# Patient Record
Sex: Male | Born: 1955 | Race: Black or African American | Hispanic: No | Marital: Single | Smoking: Former smoker
Health system: Southern US, Community
[De-identification: ages and names within clinical notes are randomized; demographics above are authoritative.]

## PROBLEM LIST (undated history)

## (undated) DIAGNOSIS — E119 Type 2 diabetes mellitus without complications: Secondary | ICD-10-CM

## (undated) DIAGNOSIS — R569 Unspecified convulsions: Secondary | ICD-10-CM

## (undated) DIAGNOSIS — I10 Essential (primary) hypertension: Secondary | ICD-10-CM

## (undated) DIAGNOSIS — K529 Noninfective gastroenteritis and colitis, unspecified: Secondary | ICD-10-CM

## (undated) DIAGNOSIS — N289 Disorder of kidney and ureter, unspecified: Secondary | ICD-10-CM

## (undated) HISTORY — PX: CHOLECYSTECTOMY: SHX55

## (undated) HISTORY — PX: JOINT REPLACEMENT: SHX530

---

## 2012-09-18 ENCOUNTER — Emergency Department (HOSPITAL_COMMUNITY)

## 2012-09-18 ENCOUNTER — Encounter (HOSPITAL_COMMUNITY): Payer: Self-pay | Admitting: *Deleted

## 2012-09-18 ENCOUNTER — Other Ambulatory Visit: Payer: Self-pay

## 2012-09-18 ENCOUNTER — Emergency Department (HOSPITAL_COMMUNITY)
Admission: EM | Admit: 2012-09-18 | Discharge: 2012-09-18 | Disposition: A | Discharge status: Home / Self Care | Attending: Emergency Medicine | Admitting: Emergency Medicine

## 2012-09-18 DIAGNOSIS — R071 Chest pain on breathing: Secondary | ICD-10-CM | POA: Insufficient documentation

## 2012-09-18 DIAGNOSIS — E119 Type 2 diabetes mellitus without complications: Secondary | ICD-10-CM | POA: Insufficient documentation

## 2012-09-18 DIAGNOSIS — Z87891 Personal history of nicotine dependence: Secondary | ICD-10-CM | POA: Insufficient documentation

## 2012-09-18 DIAGNOSIS — R079 Chest pain, unspecified: Secondary | ICD-10-CM

## 2012-09-18 DIAGNOSIS — Z880 Allergy status to penicillin: Secondary | ICD-10-CM | POA: Insufficient documentation

## 2012-09-18 DIAGNOSIS — R0789 Other chest pain: Secondary | ICD-10-CM

## 2012-09-18 DIAGNOSIS — Z79899 Other long term (current) drug therapy: Secondary | ICD-10-CM | POA: Insufficient documentation

## 2012-09-18 DIAGNOSIS — Z7982 Long term (current) use of aspirin: Secondary | ICD-10-CM | POA: Insufficient documentation

## 2012-09-18 HISTORY — DX: Type 2 diabetes mellitus without complications: E11.9

## 2012-09-18 LAB — BASIC METABOLIC PANEL
BUN: 13 mg/dL (ref 6–23)
CO2: 31 mEq/L (ref 19–32)
Chloride: 99 mEq/L (ref 96–112)
Creatinine, Ser: 0.66 mg/dL (ref 0.50–1.35)
Glucose, Bld: 239 mg/dL — ABNORMAL HIGH (ref 70–99)

## 2012-09-18 LAB — CBC WITH DIFFERENTIAL/PLATELET
Basophils Relative: 1 % (ref 0–1)
HCT: 37.1 % — ABNORMAL LOW (ref 39.0–52.0)
Hemoglobin: 12 g/dL — ABNORMAL LOW (ref 13.0–17.0)
Lymphocytes Relative: 60 % — ABNORMAL HIGH (ref 12–46)
Lymphs Abs: 2.4 10*3/uL (ref 0.7–4.0)
MCHC: 32.3 g/dL (ref 30.0–36.0)
Monocytes Absolute: 0.3 10*3/uL (ref 0.1–1.0)
Monocytes Relative: 8 % (ref 3–12)
Neutro Abs: 1.2 10*3/uL — ABNORMAL LOW (ref 1.7–7.7)
RBC: 4.4 MIL/uL (ref 4.22–5.81)

## 2012-09-18 LAB — TROPONIN I: Troponin I: <0.3 ng/mL (ref <0.30)

## 2012-09-18 MED ORDER — ASPIRIN 81 MG PO CHEW
324.0000 mg | CHEWABLE_TABLET | Freq: Once | ORAL | Status: AC
  Administered 2012-09-18: 324 mg via ORAL
  Filled 2012-09-18: qty 4

## 2012-09-18 MED ORDER — TRAMADOL HCL 50 MG PO TABS: 50.0000 mg | ORAL_TABLET | Freq: Four times a day (QID) | ORAL | Status: DC | PRN

## 2012-09-18 MED ORDER — ASPIRIN 81 MG PO CHEW: 81.0000 mg | CHEWABLE_TABLET | Freq: Every day | ORAL | Status: DC

## 2012-09-18 NOTE — ED Notes (Signed)
Sharp stabbing pain in L lateral chest at about 1550.  Now it is just sore.  No SOB, nausea/vomiting.

## 2012-09-18 NOTE — ED Provider Notes (Addendum)
History  This chart was scribed for Mervin Kung, MD by Elby Beck, ED Scribe. This patient was seen in room APA06/APA06 and the patient's care was started at 5:50 PM.   CSN: [id number]  Arrival date & time 09/18/12  1731     Chief Complaint  Patient presents with  . Chest Pain     The history is provided by the patient. No language interpreter was used.    HPI Comments: Eddie Watson is a 57 y.o. male who presents to the Emergency Department complaining of a 5-10 minute episode of stabbing, severe, "10/10" pain in his left lateral chest that occurred about 2 hours ago. Pt is insistent that he is "fine now". Pt admits that when he moves his left arm or moves his torso, he has a recurrence of mild-moderate pain, but he has no pain when he still or lying supine. Pt states that he has no h/o heart problems. Pt denies SOB, nausea, vomiting, fever, chills or any other symptoms. Pt denies alcohol use and is a former smoker.   Past Medical History  Diagnosis Date  . Diabetes mellitus without complication     Past Surgical History  Procedure Laterality Date  . Joint replacement Right      x 2    History reviewed. No pertinent family history.  History  Substance Use Topics  . Smoking status: Former Research scientist (life sciences)  . Smokeless tobacco: Never Used  . Alcohol Use: No      Review of Systems  Constitutional: Negative for fever and chills.  HENT: Negative for congestion, sore throat and neck pain.   Eyes: Negative for visual disturbance.  Respiratory: Negative for cough and shortness of breath.   Cardiovascular: Positive for chest pain. Negative for leg swelling.  Gastrointestinal: Negative for nausea, vomiting, abdominal pain and diarrhea.  Genitourinary: Negative for dysuria and hematuria.  Musculoskeletal: Negative for back pain.  Skin: Negative for rash.  Neurological: Negative for headaches.  A complete 10 system review of systems was obtained and all systems are  negative except as noted in the HPI and PMH.    Allergies  Penicillins  Home Medications   Current Outpatient Rx  Name  Route  Sig  Dispense  Refill  . glipiZIDE (GLUCOTROL) 10 MG tablet   Oral   Take 10 mg by mouth daily.         . metFORMIN (GLUCOPHAGE) 500 MG tablet   Oral   Take 500 mg by mouth 2 (two) times daily with a meal.         . aspirin 81 MG chewable tablet   Oral   Chew 1 tablet (81 mg total) by mouth daily.   30 tablet   0   . traMADol (ULTRAM) 50 MG tablet   Oral   Take 1 tablet (50 mg total) by mouth every 6 (six) hours as needed for pain.   15 tablet   0     Triage Vitals: BP 125/79  Pulse 84  Temp(Src) 98.3 F (36.8 C) (Oral)  Resp 17  Ht 6' (1.829 m)  Wt 156 lb (70.761 kg)  BMI 21.15 kg/m2  SpO2 99%  Physical Exam  Nursing note and vitals reviewed. Constitutional: He is oriented to person, place, and time. He appears well-developed and well-nourished.  HENT:  Head: Normocephalic and atraumatic.  Eyes: EOM are normal. Pupils are equal, round, and reactive to light.  Neck: Normal range of motion. Neck supple. No tracheal deviation present.  Cardiovascular:  Normal rate, regular rhythm, normal heart sounds and intact distal pulses.   No murmur heard. Pulmonary/Chest: Effort normal and breath sounds normal. No respiratory distress.  Abdominal: Soft. Bowel sounds are normal. There is no tenderness.  Musculoskeletal: Normal range of motion. He exhibits no tenderness.  Neurological: He is alert and oriented to person, place, and time.  Skin: Skin is warm and dry. No rash noted.  Psychiatric: He has a normal mood and affect.    ED Course  Procedures (including critical care time)  DIAGNOSTIC STUDIES: Oxygen Saturation is 99% on RA, normal by my interpretation.    COORDINATION OF CARE: 5:58 PM- Pt advised of plan for treatment and pt agrees.  Medications  aspirin chewable tablet 324 mg (324 mg Oral Given 09/18/12 1811)      Labs  Reviewed  CBC WITH DIFFERENTIAL - Abnormal; Notable for the following:    Hemoglobin 12.0 (*)    HCT 37.1 (*)    Neutrophils Relative % 30 (*)    Neutro Abs 1.2 (*)    Lymphocytes Relative 60 (*)    All other components within normal limits  BASIC METABOLIC PANEL - Abnormal; Notable for the following:    Glucose, Bld 239 (*)    All other components within normal limits  TROPONIN I   Dg Chest 2 View  09/18/2012   *RADIOLOGY REPORT*  Clinical Data: Left chest pain  CHEST - 2 VIEW  Comparison: None  Findings: Lung volume is normal.  Lungs are clear without infiltrate or effusion.  Negative for heart failure or mass lesion.  IMPRESSION: No acute abnormality.   Original Report Authenticated By: Carl Best, M.D.   Results for orders placed during the hospital encounter of 09/18/12  TROPONIN I      Result Value Range   Troponin I <0.30  <0.30 ng/mL  CBC WITH DIFFERENTIAL      Result Value Range   WBC 4.0  4.0 - 10.5 K/uL   RBC 4.40  4.22 - 5.81 MIL/uL   Hemoglobin 12.0 (*) 13.0 - 17.0 g/dL   HCT 37.1 (*) 39.0 - 52.0 %   MCV 84.3  78.0 - 100.0 fL   MCH 27.3  26.0 - 34.0 pg   MCHC 32.3  30.0 - 36.0 g/dL   RDW 11.6  11.5 - 15.5 %   Platelets 199  150 - 400 K/uL   Neutrophils Relative % 30 (*) 43 - 77 %   Neutro Abs 1.2 (*) 1.7 - 7.7 K/uL   Lymphocytes Relative 60 (*) 12 - 46 %   Lymphs Abs 2.4  0.7 - 4.0 K/uL   Monocytes Relative 8  3 - 12 %   Monocytes Absolute 0.3  0.1 - 1.0 K/uL   Eosinophils Relative 2  0 - 5 %   Eosinophils Absolute 0.1  0.0 - 0.7 K/uL   Basophils Relative 1  0 - 1 %   Basophils Absolute 0.0  0.0 - 0.1 K/uL  BASIC METABOLIC PANEL      Result Value Range   Sodium 138  135 - 145 mEq/L   Potassium 3.5  3.5 - 5.1 mEq/L   Chloride 99  96 - 112 mEq/L   CO2 31  19 - 32 mEq/L   Glucose, Bld 239 (*) 70 - 99 mg/dL   BUN 13  6 - 23 mg/dL   Creatinine, Ser 0.66  0.50 - 1.35 mg/dL   Calcium 9.5  8.4 - 10.5 mg/dL   GFR calc  non Af Amer >90  >90 mL/min   GFR calc Af  Amer >90  >90 mL/min    Date: 09/18/2012  Rate: 63  Rhythm: normal sinus rhythm  QRS Axis: normal  Intervals: normal  ST/T Wave abnormalities: normal  Conduction Disutrbances:none  Narrative Interpretation:   Old EKG Reviewed: none available    1. Chest pain   2. Chest wall pain       MDM  Patient's chest pain is very chest wall in nature. EKG without acute changes troponins negative. No evidence of an acute cardiac event. Chest x-ray negative for pneumonia or pneumothorax. Will treat as if chest wall pain. Will start the patient on a baby aspirin a day just based on his age. Patient will return for any newer worse symptoms.  Patient's chest pain now is only present with certain positions and movements. Clinically I feel this is related to a pulmonary embolus. Room-air saturations are 99%.         I personally performed the services described in this documentation, which was scribed in my presence. The recorded information has been reviewed and is accurate.     Mervin Kung, MD 09/18/12 [id number]  Mervin Kung, MD 09/18/12 [phone]

## 2013-09-23 ENCOUNTER — Emergency Department (HOSPITAL_COMMUNITY)

## 2013-09-23 ENCOUNTER — Encounter (HOSPITAL_COMMUNITY): Payer: Self-pay | Admitting: Emergency Medicine

## 2013-09-23 ENCOUNTER — Observation Stay (HOSPITAL_COMMUNITY)
Admission: EM | Admit: 2013-09-23 | Discharge: 2013-09-24 | Disposition: A | Discharge status: Home / Self Care | Attending: Internal Medicine | Admitting: Internal Medicine

## 2013-09-23 DIAGNOSIS — R079 Chest pain, unspecified: Secondary | ICD-10-CM

## 2013-09-23 DIAGNOSIS — E119 Type 2 diabetes mellitus without complications: Secondary | ICD-10-CM

## 2013-09-23 DIAGNOSIS — Z880 Allergy status to penicillin: Secondary | ICD-10-CM | POA: Insufficient documentation

## 2013-09-23 DIAGNOSIS — R0602 Shortness of breath: Secondary | ICD-10-CM | POA: Insufficient documentation

## 2013-09-23 DIAGNOSIS — K529 Noninfective gastroenteritis and colitis, unspecified: Secondary | ICD-10-CM | POA: Diagnosis present

## 2013-09-23 DIAGNOSIS — R072 Precordial pain: Principal | ICD-10-CM | POA: Insufficient documentation

## 2013-09-23 DIAGNOSIS — I10 Essential (primary) hypertension: Secondary | ICD-10-CM | POA: Diagnosis present

## 2013-09-23 DIAGNOSIS — R748 Abnormal levels of other serum enzymes: Secondary | ICD-10-CM

## 2013-09-23 DIAGNOSIS — D649 Anemia, unspecified: Secondary | ICD-10-CM | POA: Diagnosis present

## 2013-09-23 DIAGNOSIS — Z79899 Other long term (current) drug therapy: Secondary | ICD-10-CM | POA: Insufficient documentation

## 2013-09-23 DIAGNOSIS — Z7982 Long term (current) use of aspirin: Secondary | ICD-10-CM | POA: Insufficient documentation

## 2013-09-23 DIAGNOSIS — R61 Generalized hyperhidrosis: Secondary | ICD-10-CM | POA: Insufficient documentation

## 2013-09-23 DIAGNOSIS — Z87891 Personal history of nicotine dependence: Secondary | ICD-10-CM | POA: Insufficient documentation

## 2013-09-23 HISTORY — DX: Noninfective gastroenteritis and colitis, unspecified: K52.9

## 2013-09-23 LAB — GLUCOSE, CAPILLARY
Glucose-Capillary: 261 mg/dL — ABNORMAL HIGH (ref 70–99)
Glucose-Capillary: 170 mg/dL — ABNORMAL HIGH (ref 70–99)

## 2013-09-23 LAB — CBC WITH DIFFERENTIAL/PLATELET
BASOS ABS: 0 10*3/uL (ref 0.0–0.1)
Basophils Relative: 1 % (ref 0–1)
EOS PCT: 1 % (ref 0–5)
Eosinophils Absolute: 0.1 10*3/uL (ref 0.0–0.7)
HCT: 34.7 % — ABNORMAL LOW (ref 39.0–52.0)
Hemoglobin: 11.3 g/dL — ABNORMAL LOW (ref 13.0–17.0)
LYMPHS PCT: 46 % (ref 12–46)
Lymphs Abs: 2 10*3/uL (ref 0.7–4.0)
MCH: 27.2 pg (ref 26.0–34.0)
MCHC: 32.6 g/dL (ref 30.0–36.0)
MCV: 83.6 fL (ref 78.0–100.0)
Monocytes Absolute: 0.3 10*3/uL (ref 0.1–1.0)
Monocytes Relative: 7 % (ref 3–12)
NEUTROS ABS: 1.9 10*3/uL (ref 1.7–7.7)
NEUTROS PCT: 45 % (ref 43–77)
PLATELETS: 204 10*3/uL (ref 150–400)
RBC: 4.15 MIL/uL — ABNORMAL LOW (ref 4.22–5.81)
RDW: 12.1 % (ref 11.5–15.5)
WBC: 4.3 10*3/uL (ref 4.0–10.5)

## 2013-09-23 LAB — COMPREHENSIVE METABOLIC PANEL
ALK PHOS: 106 U/L (ref 39–117)
ALT: 57 U/L — AB (ref 0–53)
AST: 44 U/L — AB (ref 0–37)
Albumin: 3.3 g/dL — ABNORMAL LOW (ref 3.5–5.2)
BUN: 17 mg/dL (ref 6–23)
CALCIUM: 9 mg/dL (ref 8.4–10.5)
CO2: 28 meq/L (ref 19–32)
Chloride: 102 mEq/L (ref 96–112)
Creatinine, Ser: 0.88 mg/dL (ref 0.50–1.35)
GFR calc Af Amer: >90 mL/min (ref >90)
Glucose, Bld: 332 mg/dL — ABNORMAL HIGH (ref 70–99)
POTASSIUM: 4.3 meq/L (ref 3.7–5.3)
SODIUM: 140 meq/L (ref 137–147)
Total Bilirubin: 0.4 mg/dL (ref 0.3–1.2)
Total Protein: 7.1 g/dL (ref 6.0–8.3)

## 2013-09-23 LAB — TROPONIN I: Troponin I: <0.3 ng/mL (ref <0.30)

## 2013-09-23 LAB — LIPID PANEL
CHOL/HDL RATIO: 2.3 ratio
CHOLESTEROL: 110 mg/dL (ref 0–200)
HDL: 48 mg/dL (ref >39)
LDL Cholesterol: 28 mg/dL (ref 0–99)
TRIGLYCERIDES: 169 mg/dL — AB (ref <150)
VLDL: 34 mg/dL (ref 0–40)

## 2013-09-23 LAB — D-DIMER, QUANTITATIVE (NOT AT ARMC)

## 2013-09-23 LAB — LIPASE, BLOOD: Lipase: 53 U/L (ref 11–59)

## 2013-09-23 MED ORDER — ASPIRIN EC 325 MG PO TBEC
325.0000 mg | DELAYED_RELEASE_TABLET | Freq: Every day | ORAL | Status: DC
  Administered 2013-09-24: 325 mg via ORAL
  Filled 2013-09-23: qty 1

## 2013-09-23 MED ORDER — INSULIN ASPART 100 UNIT/ML ~~LOC~~ SOLN: 0.0000 [IU] | Freq: Every day | SUBCUTANEOUS | Status: DC

## 2013-09-23 MED ORDER — LOPERAMIDE HCL 2 MG PO CAPS
2.0000 mg | ORAL_CAPSULE | ORAL | Status: DC | PRN
  Administered 2013-09-23 – 2013-09-24 (×5): 2 mg via ORAL
  Filled 2013-09-23 (×6): qty 1

## 2013-09-23 MED ORDER — ALUM & MAG HYDROXIDE-SIMETH 200-200-20 MG/5ML PO SUSP: 30.0000 mL | Freq: Four times a day (QID) | ORAL | Status: DC | PRN

## 2013-09-23 MED ORDER — SODIUM CHLORIDE 0.9 % IV SOLN
INTRAVENOUS | Status: AC
  Administered 2013-09-23: 16:00:00 via INTRAVENOUS

## 2013-09-23 MED ORDER — ONDANSETRON HCL 4 MG PO TABS: 4.0000 mg | ORAL_TABLET | Freq: Four times a day (QID) | ORAL | Status: DC | PRN

## 2013-09-23 MED ORDER — ENOXAPARIN SODIUM 40 MG/0.4ML ~~LOC~~ SOLN: 40.0000 mg | Freq: Every day | SUBCUTANEOUS | Status: DC

## 2013-09-23 MED ORDER — ONDANSETRON HCL 4 MG/2ML IJ SOLN: 4.0000 mg | Freq: Four times a day (QID) | INTRAMUSCULAR | Status: DC | PRN

## 2013-09-23 MED ORDER — SODIUM CHLORIDE 0.9 % IV SOLN
INTRAVENOUS | Status: DC
  Administered 2013-09-23: 10:00:00 via INTRAVENOUS

## 2013-09-23 MED ORDER — ACETAMINOPHEN 650 MG RE SUPP: 650.0000 mg | Freq: Four times a day (QID) | RECTAL | Status: DC | PRN

## 2013-09-23 MED ORDER — BENAZEPRIL HCL 10 MG PO TABS
10.0000 mg | ORAL_TABLET | Freq: Every day | ORAL | Status: DC
  Administered 2013-09-23 – 2013-09-24 (×2): 10 mg via ORAL
  Filled 2013-09-23 (×2): qty 1

## 2013-09-23 MED ORDER — ACETAMINOPHEN 325 MG PO TABS: 650.0000 mg | ORAL_TABLET | Freq: Four times a day (QID) | ORAL | Status: DC | PRN

## 2013-09-23 MED ORDER — HYDROCODONE-ACETAMINOPHEN 5-325 MG PO TABS: 1.0000 | ORAL_TABLET | ORAL | Status: DC | PRN

## 2013-09-23 MED ORDER — INSULIN ASPART 100 UNIT/ML ~~LOC~~ SOLN
0.0000 [IU] | Freq: Three times a day (TID) | SUBCUTANEOUS | Status: DC
  Administered 2013-09-23: 11 [IU] via SUBCUTANEOUS
  Administered 2013-09-24: 8 [IU] via SUBCUTANEOUS

## 2013-09-23 MED ORDER — MORPHINE SULFATE 2 MG/ML IJ SOLN: 1.0000 mg | INTRAMUSCULAR | Status: DC | PRN

## 2013-09-23 MED ORDER — NITROGLYCERIN 0.4 MG SL SUBL: 0.4000 mg | SUBLINGUAL_TABLET | SUBLINGUAL | Status: DC | PRN

## 2013-09-23 MED ORDER — SODIUM CHLORIDE 0.9 % IJ SOLN
3.0000 mL | Freq: Two times a day (BID) | INTRAMUSCULAR | Status: DC
  Administered 2013-09-23: 3 mL via INTRAVENOUS

## 2013-09-23 NOTE — Consult Note (Signed)
Reason for Consult: Chest pain Referring Physician: PTH  Eddie Watson is an 58 y.o. male.  HPI: This is a 58 year old male diabetic patient who was transferred from a correctional facility with chest pain. He has no prior cardiac history. Patient describes sharp shooting left-sided chest pain radiating to his shoulders and down his leg. It occurred when he was in class.Patient was given aspirin 324 mg, 3 nitroglycerin, and Zofran 4 mg which improved pain. He says now it hurts when he talks but isn't as severe. He denies any heavy pressure, tightness, dyspnea, palpitations, dizziness, or presyncope. He walks about 7 miles per week without symptoms. His blood pressure was elevated. EKG NSR with nonspecific ST Twave changes, first troponin negative.  He has no prior history of hypertension. He smoked for about 4 years but quit 20 years ago. He has no family history of CAD or hyperlipidemia.    Past Medical History  Diagnosis Date  . Diabetes mellitus without complication   . Chronic diarrhea     Past Surgical History  Procedure Laterality Date  . Joint replacement Right      x 2  . Cholecystectomy      No family history on file.  Social History:  reports that he has quit smoking. He has never used smokeless tobacco. He reports that he does not drink alcohol or use illicit drugs.  Allergies:  Allergies  Allergen Reactions  . Penicillins Anaphylaxis  . Tomato     Face swells up like a basketball    Medications:  Scheduled Meds: . [START ON 09/24/2013] aspirin EC  325 mg Oral Daily  . enoxaparin (LOVENOX) injection  40 mg Subcutaneous Daily  . insulin aspart  0-15 Units Subcutaneous TID WC  . insulin aspart  0-5 Units Subcutaneous QHS  . sodium chloride  3 mL Intravenous Q12H   Continuous Infusions: . sodium chloride     PRN Meds:.acetaminophen, acetaminophen, alum & mag hydroxide-simeth, HYDROcodone-acetaminophen, loperamide, morphine injection, nitroGLYCERIN, ondansetron  (ZOFRAN) IV, ondansetron   Results for orders placed during the hospital encounter of 09/23/13 (from the past 48 hour(s))  TROPONIN I     Status: None   Collection Time    09/23/13  9:58 AM      Result Value Ref Range   Troponin I <0.30  <0.30 ng/mL   Comment:            Due to the release kinetics of cTnI,     a negative result within the first hours     of the onset of symptoms does not rule out     myocardial infarction with certainty.     If myocardial infarction is still suspected,     repeat the test at appropriate intervals.  CBC WITH DIFFERENTIAL     Status: Abnormal   Collection Time    09/23/13  9:58 AM      Result Value Ref Range   WBC 4.3  4.0 - 10.5 K/uL   RBC 4.15 (*) 4.22 - 5.81 MIL/uL   Hemoglobin 11.3 (*) 13.0 - 17.0 g/dL   HCT 34.7 (*) 39.0 - 52.0 %   MCV 83.6  78.0 - 100.0 fL   MCH 27.2  26.0 - 34.0 pg   MCHC 32.6  30.0 - 36.0 g/dL   RDW 12.1  11.5 - 15.5 %   Platelets 204  150 - 400 K/uL   Neutrophils Relative % 45  43 - 77 %   Neutro Abs 1.9  1.7 -  7.7 K/uL   Lymphocytes Relative 46  12 - 46 %   Lymphs Abs 2.0  0.7 - 4.0 K/uL   Monocytes Relative 7  3 - 12 %   Monocytes Absolute 0.3  0.1 - 1.0 K/uL   Eosinophils Relative 1  0 - 5 %   Eosinophils Absolute 0.1  0.0 - 0.7 K/uL   Basophils Relative 1  0 - 1 %   Basophils Absolute 0.0  0.0 - 0.1 K/uL  COMPREHENSIVE METABOLIC PANEL     Status: Abnormal   Collection Time    09/23/13  9:58 AM      Result Value Ref Range   Sodium 140  137 - 147 mEq/L   Potassium 4.3  3.7 - 5.3 mEq/L   Chloride 102  96 - 112 mEq/L   CO2 28  19 - 32 mEq/L   Glucose, Bld 332 (*) 70 - 99 mg/dL   BUN 17  6 - 23 mg/dL   Creatinine, Ser 0.88  0.50 - 1.35 mg/dL   Calcium 9.0  8.4 - 10.5 mg/dL   Total Protein 7.1  6.0 - 8.3 g/dL   Albumin 3.3 (*) 3.5 - 5.2 g/dL   AST 44 (*) 0 - 37 U/L   ALT 57 (*) 0 - 53 U/L   Alkaline Phosphatase 106  39 - 117 U/L   Total Bilirubin 0.4  0.3 - 1.2 mg/dL   GFR calc non Af Amer >90  >90  mL/min   GFR calc Af Amer >90  >90 mL/min   Comment: (NOTE)     The eGFR has been calculated using the CKD EPI equation.     This calculation has not been validated in all clinical situations.     eGFR's persistently <90 mL/min signify possible Chronic Kidney     Disease.  GLUCOSE, CAPILLARY     Status: Abnormal   Collection Time    09/23/13  1:13 PM      Result Value Ref Range   Glucose-Capillary 261 (*) 70 - 99 mg/dL   Comment 1 Documented in Chart     Comment 2 Notify RN    LIPID PANEL     Status: Abnormal   Collection Time    09/23/13  1:30 PM      Result Value Ref Range   Cholesterol 110  0 - 200 mg/dL   Triglycerides 169 (*) <150 mg/dL   HDL 48  >39 mg/dL   Total CHOL/HDL Ratio 2.3     VLDL 34  0 - 40 mg/dL   LDL Cholesterol 28  0 - 99 mg/dL   Comment:            Total Cholesterol/HDL:CHD Risk     Coronary Heart Disease Risk Table                         Men   Women      1/2 Average Risk   3.4   3.3      Average Risk       5.0   4.4      2 X Average Risk   9.6   7.1      3 X Average Risk  23.4   11.0                Use the calculated Patient Ratio     above and the CHD Risk Table     to determine the patient's  CHD Risk.                ATP III CLASSIFICATION (LDL):      <100     mg/dL   Optimal      100-129  mg/dL   Near or Above                        Optimal      130-159  mg/dL   Borderline      160-189  mg/dL   High      >190     mg/dL   Very High    Dg Chest 2 View  09/23/2013   CLINICAL DATA:  Chest pain, difficulty breathing  EXAM: CHEST  2 VIEW  COMPARISON:  Chest radiograph 09/19/2002  FINDINGS: Normal mediastinum and cardiac silhouette. Normal pulmonary vasculature. No evidence of effusion, infiltrate, or pneumothorax. No acute bony abnormality.  IMPRESSION: No acute cardiopulmonary process.   Electronically Signed   By: Suzy Bouchard M.D.   On: 09/23/2013 10:26    ROS See HPI Eyes: Negative Ears:Negative for hearing loss,  tinnitus Cardiovascular: Negative for chest pain, palpitations,irregular heartbeat, dyspnea, dyspnea on exertion, near-syncope, orthopnea, paroxysmal nocturnal dyspnea and syncope,edema, claudication, cyanosis,.  Respiratory:   Negative for cough, hemoptysis, shortness of breath, sleep disturbances due to breathing, sputum production and wheezing.   Endocrine: Negative for cold intolerance and heat intolerance.  Hematologic/Lymphatic: Negative for adenopathy and bleeding problem. Does not bruise/bleed easily.  Musculoskeletal: Negative.   Gastrointestinal: Chronic diarrhea. Gallbladder out last year Negative for nausea, vomiting, reflux, abdominal pain, constipation.   Genitourinary: Negative for bladder incontinence, dysuria, flank pain, frequency, hematuria, hesitancy, nocturia and urgency.  Neurological: Negative.  Allergic/Immunologic: Negative for environmental allergies.  Blood pressure 157/92, pulse 63, temperature 98.1 F (36.7 C), temperature source Oral, resp. rate 20, height 6' (1.829 m), weight 162 lb (73.483 kg), SpO2 100.00%. Physical Exam PHYSICAL EXAM: Well-nournished, in no acute distress. Neck: No JVD, HJR, Bruit, or thyroid enlargement Lungs: No tachypnea, clear without wheezing, rales, or rhonchi Cardiovascular: RRR, PMI not displaced, positive S4, no murmur, bruit, thrill, or heave. Abdomen: BS normal. Tender left upper quadrant without organomegaly, massesExtremities: without cyanosis, clubbing or edema. Good distal pulses bilateral SKin: Warm, no lesions or rashes  Musculoskeletal: No deformities Neuro: no focal signs    Assessment/Plan:  Chest pain: Symptoms atypical and EKG with nonspecific ST T wave abnormality. First troponin negative. If the rest of the troponins are negative recommend stress echo in the morning. 2 Decho pending.  Abdominal discomfort(LUQ on exam) with anemia: hospitalist aware  Diabetes mellitus  Hypertension BP elevated on admission, but  no history of HTN. Start lotensin 10 mg daily.  Chronic diarrhea  Eddie Watson 09/23/2013, 2:06 PM     Patient seen and discussed with PA Eddie Watson, agree with documentation above. Fairly atypical symptoms, however he is diabetic and certainly is at risk for atypical angina. No current evidence of ACS, trop neg x 2. EKG non-specific ST/T changes.  Will plan for stress echo in AM to further evaluate and risk stratify for ischemia.   Zandra Abts MD

## 2013-09-23 NOTE — H&P (Signed)
Patient seen and examined.  Note reviewed.  Patient has been admitted with shortness of breath and chest pressure. He's not had any prior cardiac testing. He was seen by cardiology and recommended stress echo in the morning. Cardiac enzymes are thus far negative. EKG is nonacute. D-dimer is negative. He did have mildly elevated liver enzymes. He is tender to palpation in the right upper quadrant. We'll check lipase as well as right upper quadrant ultrasound.  Eddie Watson

## 2013-09-23 NOTE — ED Provider Notes (Signed)
CSN: [id number]     Arrival date & time 09/23/13  [id number] History  This chart was scribed for Leota Jacobsen, MD by Girtha Hake, ED Scribe. The patient was seen in APA10/APA10. The patient's care was started at 10:05 AM.    Chief Complaint  Patient presents with  . Chest Pain    The history is provided by the patient. No language interpreter was used.   HPI Comments: Eddie Watson is a 58 y.o. male who presents to the Emergency Department complaining of substernal chest pain that began this morning and has since resolved. Patient characterized pain as pressure and states that the pain radiated to both arms. Patient reports associated diaphoresis and shortness of breath that have also improved. Per EMS, patient's pain was an 8/10 upon personnel arrival. Patient was given aspirin 324 mg, 3 nitroglycerin, and Zofran 4 mg which improved pain. Currently, patient is complaining of a mild headache, but no other symptoms. He denies a history of cardiac disease. He reports that he is has never experienced similar symptoms before. He denies fever, chills, nausea, vomiting, congestion, cough, sore throat, rhinorrhea, ear pain, leg swelling, or diarrhea. He has a history of DM.  Past Medical History  Diagnosis Date  . Diabetes mellitus without complication    Past Surgical History  Procedure Laterality Date  . Joint replacement Right      x 2  . Cholecystectomy     No family history on file. History  Substance Use Topics  . Smoking status: Former Research scientist (life sciences)  . Smokeless tobacco: Never Used  . Alcohol Use: No    Review of Systems  Constitutional: Positive for diaphoresis. Negative for chills and fatigue.  HENT: Negative for congestion, ear pain, rhinorrhea and sore throat.   Respiratory: Positive for shortness of breath. Negative for cough.   Cardiovascular: Positive for chest pain. Negative for leg swelling.  Gastrointestinal: Negative for nausea, vomiting and diarrhea.  All other systems  reviewed and are negative.   Allergies  Penicillins  Home Medications   Prior to Admission medications   Medication Sig Start Date End Date Taking? Authorizing Provider  aspirin 81 MG chewable tablet Chew 1 tablet (81 mg total) by mouth daily. 09/18/12   Fredia Sorrow, MD  glipiZIDE (GLUCOTROL) 10 MG tablet Take 10 mg by mouth daily.    Historical Provider, MD  metFORMIN (GLUCOPHAGE) 500 MG tablet Take 500 mg by mouth 2 (two) times daily with a meal.    Historical Provider, MD  traMADol (ULTRAM) 50 MG tablet Take 1 tablet (50 mg total) by mouth every 6 (six) hours as needed for pain. 09/18/12   Fredia Sorrow, MD   Triage Vitals: BP 151/97  Pulse 93  Temp(Src) 98.4 F (36.9 C)  Resp 18  Ht 6' (1.829 m)  Wt 162 lb (73.483 kg)  BMI 21.97 kg/m2  SpO2 98% Physical Exam  Nursing note and vitals reviewed. Constitutional: He is oriented to person, place, and time. He appears well-developed and well-nourished.  Non-toxic appearance. No distress.  HENT:  Head: Normocephalic and atraumatic.  Eyes: Conjunctivae, EOM and lids are normal. Pupils are equal, round, and reactive to light.  Neck: Normal range of motion. Neck supple. No tracheal deviation present. No mass present.  Cardiovascular: Normal rate, regular rhythm and normal heart sounds.  Exam reveals no gallop.   No murmur heard. Pulmonary/Chest: Effort normal and breath sounds normal. No stridor. No respiratory distress. He has no decreased breath sounds. He has no wheezes.  He has no rhonchi. He has no rales.  Abdominal: Soft. Normal appearance and bowel sounds are normal. He exhibits no distension. There is no tenderness. There is no rebound and no CVA tenderness.  Musculoskeletal: Normal range of motion. He exhibits no edema and no tenderness.  Neurological: He is alert and oriented to person, place, and time. He has normal strength. No cranial nerve deficit or sensory deficit. GCS eye subscore is 4. GCS verbal subscore is 5. GCS  motor subscore is 6.  Skin: Skin is warm and dry. No abrasion and no rash noted.  Psychiatric: He has a normal mood and affect. His speech is normal and behavior is normal.    ED Course  Procedures (including critical care time) DIAGNOSTIC STUDIES: Oxygen Saturation is 98% on room air, normal by my interpretation.    COORDINATION OF CARE: 10:10 AM-Discussed treatment plan which includes IV fluids, CXR, EKG, and labs with pt at bedside and pt agreed to plan.     Labs Review Labs Reviewed  CBC WITH DIFFERENTIAL - Abnormal; Notable for the following:    RBC 4.15 (*)    Hemoglobin 11.3 (*)    HCT 34.7 (*)    All other components within normal limits  COMPREHENSIVE METABOLIC PANEL - Abnormal; Notable for the following:    Glucose, Bld 332 (*)    Albumin 3.3 (*)    AST 44 (*)    ALT 57 (*)    All other components within normal limits  TROPONIN I    Imaging Review Dg Chest 2 View  09/23/2013   CLINICAL DATA:  Chest pain, difficulty breathing  EXAM: CHEST  2 VIEW  COMPARISON:  Chest radiograph 09/19/2002  FINDINGS: Normal mediastinum and cardiac silhouette. Normal pulmonary vasculature. No evidence of effusion, infiltrate, or pneumothorax. No acute bony abnormality.  IMPRESSION: No acute cardiopulmonary process.   Electronically Signed   By: Suzy Bouchard M.D.   On: 09/23/2013 10:26     EKG Interpretation   Date/Time:  Wednesday September 23 2013 09:53:46 EDT Ventricular Rate:  94 PR Interval:  146 QRS Duration: 85 QT Interval:  393 QTC Calculation: 491 R Axis:   61 Text Interpretation:  Sinus rhythm Nonspecific T abnrm, anterolateral  leads Borderline prolonged QT interval Confirmed by Zenia Resides  MD, ANTHONY  ([id number]) on 09/23/2013 9:56:43 AM      MDM   Final diagnoses:  None   Patient to be made for evaluation of chest pain.  Leota Jacobsen, MD 09/23/13 1052

## 2013-09-23 NOTE — ED Notes (Signed)
Pt c/o mid-sternal cp with sob/dizziness/nausea at 0815. Per ems-cp 8/10 upon arrival, decreased to 0/10 after 324 asa, 3 sl nitro and 4mg  zofran iv. Pt denies pain at present.

## 2013-09-23 NOTE — H&P (Signed)
[facility] History and Physical  Fitzhugh [id number] DOB: December 26, 1955 DOA: 09/23/2013  Referring physician: Zenia Resides PCP: No PCP Per Patient   Chief Complaint: chest pain  HPI: Eddie Watson is a 58 y.o. male with a past medical history that includes diabetes presents to the emergency department from a correctional facility with chief complaint of chest pain. Information is obtained from the patient. He indicates that he was sitting in class this morning when suddenly he developed pain in his substernal chest area. He describes the pain as sharp and pressure-like that created a not". He states the pain radiated to both arms and he also experienced some pain in his left leg. He states that he got up and ambulated a short distance to his room during this time the pain increased. Associated symptoms include shortness of breath diaphoresis, dizziness, nausea without vomiting. He rates the pain a 8/10. He states that the staff came to evaluate him and EMS was called. He states the pain persisted until he got to the emergency room. He denies abdominal pain dysuria hematuria frequency or urgency. He does endorse chronic diarrhea that he attributed to his oral diabetic medications however he has not taken this medication in several months he had the diarrhea persists. He denies melena bright red blood per rectum. He states he checks his blood sugar daily and that he has not been on any diabetic medications for several months.    Prior to that during transport he received 324 mg of aspirin, 3 nitroglycerin tablets and Zofran 4 mg. Initial workup in the emergency room includes complete metabolic panel significant for serum glucose of 332 AST 44 ALT 57, initial troponin negative, chest x-ray unremarkable, EKG with normal sinus rhythm and unspecified T wave abnormality. He is hemodynamically stable slightly hypertensive and not hypoxic. We are asked to admit for further evaluation and  treatment  Review of Systems:  10 point review of systems completed and all systems are negative except as indicated in the history of present illness  Past Medical History  Diagnosis Date  . Diabetes mellitus without complication   . Chronic diarrhea    Past Surgical History  Procedure Laterality Date  . Joint replacement Right      x 2  . Cholecystectomy     Social History:  reports that he has quit smoking. He has never used smokeless tobacco. He reports that he does not drink alcohol or use illicit drugs.  Allergies  Allergen Reactions  . Penicillins Anaphylaxis  . Tomato     Face swells up like a basketball    No family history on file.   Prior to Admission medications   Medication Sig Start Date End Date Taking? Authorizing Provider  loperamide (IMODIUM) 2 MG capsule Take 2 mg by mouth as needed for diarrhea or loose stools.   Yes Historical Provider, MD   Physical Exam: Filed Vitals:   09/23/13 1100  BP: 155/92  Pulse: 72  Temp:   Resp: 16    BP 155/92  Pulse 72  Temp(Src) 98.4 F (36.9 C)  Resp 16  Ht 6' (1.829 m)  Wt 73.483 kg (162 lb)  BMI 21.97 kg/m2  SpO2 99%  General:  Appears calm and comfortable Eyes: PERRL, normal lids, irises & conjunctiva ENT: Ears are clear nose without drainage oropharynx without erythema or exudate. Mucous membranes of his mouth are pink but dry Neck: no LAD, masses or thyromegaly Cardiovascular: RRR, no m/r/g. No LE edema. Mild tenderness to palpation  and substernal area Telemetry: SR, no arrhythmias  Respiratory: CTA bilaterally, no w/r/r. Normal respiratory effort. Abdomen: soft appears mildly distended, ntnd positive bowel sounds throughout. Nontender to palpate Skin: no rash or induration seen on limited exam Musculoskeletal: grossly normal tone BUE/BLE Psychiatric: grossly normal mood and affect, speech fluent and appropriate Neurologic: grossly non-focal. Speech is clear facial symmetry           Labs on  Admission:  Basic Metabolic Panel:  Recent Labs Lab 09/23/13 0958  NA 140  K 4.3  CL 102  CO2 28  GLUCOSE 332*  BUN 17  CREATININE 0.88  CALCIUM 9.0   Liver Function Tests:  Recent Labs Lab 09/23/13 0958  AST 44*  ALT 57*  ALKPHOS 106  BILITOT 0.4  PROT 7.1  ALBUMIN 3.3*   No results found for this basename: LIPASE, AMYLASE,  in the last 168 hours No results found for this basename: AMMONIA,  in the last 168 hours CBC:  Recent Labs Lab 09/23/13 0958  WBC 4.3  NEUTROABS 1.9  HGB 11.3*  HCT 34.7*  MCV 83.6  PLT 204   Cardiac Enzymes:  Recent Labs Lab 09/23/13 0958  TROPONINI <0.30    BNP (last 3 results) No results found for this basename: PROBNP,  in the last 8760 hours CBG: No results found for this basename: GLUCAP,  in the last 168 hours  Radiological Exams on Admission: Dg Chest 2 View  09/23/2013   CLINICAL DATA:  Chest pain, difficulty breathing  EXAM: CHEST  2 VIEW  COMPARISON:  Chest radiograph 09/19/2002  FINDINGS: Normal mediastinum and cardiac silhouette. Normal pulmonary vasculature. No evidence of effusion, infiltrate, or pneumothorax. No acute bony abnormality.  IMPRESSION: No acute cardiopulmonary process.   Electronically Signed   By: Suzy Bouchard M.D.   On: 09/23/2013 10:26    EKG: Independently reviewed. Sinus rhythm with nonspecific T wave abnormality  Assessment/Plan Principal Problem:   Chest pain: At rest. Symptoms with some typical and atypical features. Risk factors include diabetes and former smoker. Will admit to telemetry for observation to rule out. We'll cycle his cardiac enzymes will obtain a 2-D echo. Repeat EKG in the a.m. will request cardiology consult patient may need stress test and it may need to be in patient given his present status of being incarcerated. Will keep him n.p.o. until cardiology evaluates and makes recommendations  Active Problems:   Diabetes: Serum glucose 332 on admission. He states he has not  been on any medications for several months because his glucose was "okay". He checks it daily. Chart review from facility indicates that he has been on oral medications in the past but he stopped taking them do to GI side effects. Will obtain a hemoglobin A1c. Will use sliding scale insulin for optimal control to    Chronic diarrhea: Patient states that he takes Imodium for chronic diarrhea. Chart review indicates that he attributed diarrhea 2 oral diabetic medications but he has not taken these medications in quite some time and the diarrhea continues. Will continue his when necessary Imodium. Will check stool  Hypertension: No history documented. Systolic blood pressure range 150 [id number] and diastolic blood pressure range 90-97 on admission. They be related to #1. Will monitor closely if continues to be borderline high will consider antihypertensive meds     cardiology  Code Status: full Family Communication: guards at bedside Disposition Plan: back to correction facility  Time spent: 65 minutes  [facility] Pager [phone]  **  Disclaimer: This note may have been dictated with voice recognition software. Similar sounding words can inadvertently be transcribed and this note may contain transcription errors which may not have been corrected upon publication of note.**

## 2013-09-23 NOTE — ED Notes (Signed)
MD at bedside. 

## 2013-09-24 ENCOUNTER — Observation Stay (HOSPITAL_COMMUNITY)

## 2013-09-24 ENCOUNTER — Encounter (HOSPITAL_COMMUNITY): Payer: Self-pay

## 2013-09-24 DIAGNOSIS — I517 Cardiomegaly: Secondary | ICD-10-CM | POA: Diagnosis not present

## 2013-09-24 DIAGNOSIS — R197 Diarrhea, unspecified: Secondary | ICD-10-CM

## 2013-09-24 DIAGNOSIS — R079 Chest pain, unspecified: Secondary | ICD-10-CM | POA: Diagnosis not present

## 2013-09-24 DIAGNOSIS — I10 Essential (primary) hypertension: Secondary | ICD-10-CM | POA: Diagnosis not present

## 2013-09-24 LAB — GLUCOSE, CAPILLARY
GLUCOSE-CAPILLARY: 330 mg/dL — AB (ref 70–99)
GLUCOSE-CAPILLARY: 174 mg/dL — AB (ref 70–99)
Glucose-Capillary: 277 mg/dL — ABNORMAL HIGH (ref 70–99)

## 2013-09-24 LAB — TSH: TSH: 2.17 u[IU]/mL (ref 0.350–4.500)

## 2013-09-24 LAB — COMPREHENSIVE METABOLIC PANEL
ALBUMIN: 3.2 g/dL — AB (ref 3.5–5.2)
ALK PHOS: 133 U/L — AB (ref 39–117)
ALT: 55 U/L — ABNORMAL HIGH (ref 0–53)
AST: 38 U/L — ABNORMAL HIGH (ref 0–37)
BUN: 19 mg/dL (ref 6–23)
CO2: 27 mEq/L (ref 19–32)
Calcium: 8.9 mg/dL (ref 8.4–10.5)
Chloride: 99 mEq/L (ref 96–112)
Creatinine, Ser: 0.82 mg/dL (ref 0.50–1.35)
GFR calc Af Amer: >90 mL/min (ref >90)
GFR calc non Af Amer: >90 mL/min (ref >90)
Glucose, Bld: 190 mg/dL — ABNORMAL HIGH (ref 70–99)
Potassium: 4.2 mEq/L (ref 3.7–5.3)
Sodium: 137 mEq/L (ref 137–147)
TOTAL PROTEIN: 7.2 g/dL (ref 6.0–8.3)
Total Bilirubin: 0.3 mg/dL (ref 0.3–1.2)

## 2013-09-24 LAB — HEMOGLOBIN A1C
Hgb A1c MFr Bld: 10.3 % — ABNORMAL HIGH (ref <5.7)
Mean Plasma Glucose: 249 mg/dL — ABNORMAL HIGH (ref <117)

## 2013-09-24 MED ORDER — GLYBURIDE 5 MG PO TABS: 5.0000 mg | ORAL_TABLET | Freq: Two times a day (BID) | ORAL | Status: DC

## 2013-09-24 MED ORDER — BENAZEPRIL HCL 10 MG PO TABS: 10.0000 mg | ORAL_TABLET | Freq: Every day | ORAL | Status: DC

## 2013-09-24 MED ORDER — GLYBURIDE 5 MG PO TABS
5.0000 mg | ORAL_TABLET | Freq: Two times a day (BID) | ORAL | Status: DC
  Administered 2013-09-24: 5 mg via ORAL
  Filled 2013-09-24: qty 1

## 2013-09-24 NOTE — Progress Notes (Signed)
  Echocardiogram Echocardiogram Stress Test has been performed.  [place], [place] 09/24/2013, 10:32 AM

## 2013-09-24 NOTE — Discharge Summary (Signed)
Patient seen and examined. Note reviewed.  The patient underwent  Stress echocardiogram today which was found to be normal study.cardiology has signed off on the patient he does not require any further cardiac workup. He is ruled out for ACS with negative cardiac markers.  LFTs were noted to be mildly elevated. He is status post cholecystectomy. Right upper quadrant ultrasound was also unremarkable. Lipase is normal. Further workup to be done as an outpatient as needed. Would recommend GI follow as an outpatient since the patient also has chronic diarrhea. Would avoid statin at this time.  MEMON,JEHANZEB

## 2013-09-24 NOTE — Discharge Summary (Signed)
Physician Discharge Summary  Eddie Watson [id number] DOB: Feb 22, 1956 DOA: 09/23/2013  PCP: No PCP Per Patient  Admit date: 09/23/2013 Discharge date: 09/24/2013  Time spent: 40 minutes  Recommendations for Outpatient Follow-up:  1. Back to correctional facility. Close OP monitoring of DM for optimal control. Recommend monitoring BP as anti-hypertensive med started 2. Recommend OP GI follow up for chronic diarrhea  Discharge Diagnoses:  Principal Problem:   Chest pain Active Problems:   Diabetes   Chronic diarrhea   HTN (hypertension)   Chest pain at rest   Anemia   Discharge Condition: stable  Diet recommendation: carb modified  Filed Weights   09/23/13 0953 09/24/13 0500  Weight: 73.483 kg (162 lb) 69.31 kg (152 lb 12.8 oz)    History of present illness:  Eddie Watson is a 58 y.o. male with a past medical history that includes diabetes presented to the emergency department from a correctional facility with chief complaint of chest pain. Information obtained from the patient. He indicated that he was sitting in class when suddenly he developed pain in his substernal chest area. He described the pain as sharp and pressure-like that created "a knot". He stated the pain radiated to both arms and he also experienced some pain in his left leg. He stated that he got up and ambulated a short distance to his room during this time the pain increased. Associated symptoms included shortness of breath diaphoresis, dizziness, nausea without vomiting. He rated the pain a 8/10. He stated that the staff came to evaluate him and EMS was called. He stated the pain persisted until he got to the emergency room. He denied abdominal pain dysuria hematuria frequency or urgency. He endorsed chronic diarrhea that he attributed to his oral diabetic medications however he has not taken this medication in several months yet the diarrhea persists. He denied melena bright red blood per rectum. He stated he  checks his blood sugar daily and that he has not been on any diabetic medications for several months.  Prior to he received 324 mg of aspirin, 3 nitroglycerin tablets and Zofran 4 mg. Initial workup in the emergency room included complete metabolic panel significant for serum glucose of 332 AST 44 ALT 57, initial troponin negative, chest x-ray unremarkable, EKG with normal sinus rhythm and unspecified T wave abnormality. He was hemodynamically stable slightly hypertensive and not hypoxic.   Hospital Course:  Principal Problem:  Chest pain: At rest. Atypical. Risk factors include diabetes and former smoker. Admitted  to telemetry for observation to rule out. Cardiac enzymes negative x3,  a 2-D echo mild LVH with LVEF 0000000, normal diastolic function overall. Upper normal left atrial chamber size. No major valvular abnormalities. Unable to assess PASP. No pericardial effusion. EKG without evidence of ACS.  Stress echo with low risk Duke treadmill score. Dr Harl Bowie with cardiology opines chest pain appears to be non-cardiac. No further cardiac testing planned. No statin due to slightly elevated liver function tests.  Active Problems:  Diabetes: Serum glucose 332 on admission. He stated he had not been on any medications for several months because his glucose was "okay".  Chart review from facility indicates that he has been on oral medications in the past but he stopped taking them do to GI side effects. Hemoglobin A1c 10.3. Will discharge on glyburide. Recommend close OP monitoring for optimal control.    Chronic diarrhea: Patient stated that he takes Imodium for chronic diarrhea. Chart review indicates that he attributed diarrhea to oral diabetic medications  but he had not taken these medications in quite some time and the diarrhea continues. No diarrhea during this hospitalization. Abdominal US negative.  Recommend OP follow up.    Hypertension: No history documented. Resolved at discharge.     Procedures:  Echo stress test negative 09/23/13  Consultations: cardiology Discharge Exam: Filed Vitals:   09/24/13 0449  BP: 132/77  Pulse: 56  Temp: 97.9 F (36.6 C)  Resp: 20    General: well nourished NAD Cardiovascular: RRR No MGR no LE edema Respiratory: normal effort BS clear bilaterally no wheeze  Discharge Instructions You were cared for by a hospitalist during your hospital stay. If you have any questions about your discharge medications or the care you received while you were in the hospital after you are discharged, you can call the unit and asked to speak with the hospitalist on call if the hospitalist that took care of you is not available. Once you are discharged, your primary care physician will handle any further medical issues. Please note that NO REFILLS for any discharge medications will be authorized once you are discharged, as it is imperative that you return to your primary care physician (or establish a relationship with a primary care physician if you do not have one) for your aftercare needs so that they can reassess your need for medications and monitor your lab values.     Medication List    ASK your doctor about these medications       loperamide 2 MG capsule  Commonly known as:  IMODIUM  Take 2 mg by mouth as needed for diarrhea or loose stools.       Allergies  Allergen Reactions  . Penicillins Anaphylaxis  . Tomato     Face swells up like a basketball      The results of significant diagnostics from this hospitalization (including imaging, microbiology, ancillary and laboratory) are listed below for reference.    Significant Diagnostic Studies: Dg Chest 2 View  09/23/2013   CLINICAL DATA:  Chest pain, difficulty breathing  EXAM: CHEST  2 VIEW  COMPARISON:  Chest radiograph 09/19/2002  FINDINGS: Normal mediastinum and cardiac silhouette. Normal pulmonary vasculature. No evidence of effusion, infiltrate, or pneumothorax. No acute bony  abnormality.  IMPRESSION: No acute cardiopulmonary process.   Electronically Signed   By: Suzy Bouchard M.D.   On: 09/23/2013 10:26   US Abdomen Limited Ruq  09/24/2013   CLINICAL DATA:  Elevated liver enzymes and upper abdominal pain  EXAM: US ABDOMEN LIMITED - RIGHT UPPER QUADRANT  COMPARISON:  None.  FINDINGS: Gallbladder:  Surgically absent.  Common bile duct:  Diameter: 5 mm. No intrahepatic or extrahepatic biliary duct dilatation.  Liver:  No focal lesion identified. Within normal limits in parenchymal echogenicity.  IMPRESSION: Gallbladder absent.  Study otherwise unremarkable.   Electronically Signed   By: Lowella Grip M.D.   On: 09/24/2013 12:36    Microbiology: No results found for this or any previous visit (from the past 240 hour(s)).   Labs: Basic Metabolic Panel:  Recent Labs Lab 09/23/13 0958 09/24/13 0545  NA 140 137  K 4.3 4.2  CL 102 99  CO2 28 27  GLUCOSE 332* 190*  BUN 17 19  CREATININE 0.88 0.82  CALCIUM 9.0 8.9   Liver Function Tests:  Recent Labs Lab 09/23/13 0958 09/24/13 0545  AST 44* 38*  ALT 57* 55*  ALKPHOS 106 133*  BILITOT 0.4 0.3  PROT 7.1 7.2  ALBUMIN 3.3* 3.2*  Recent Labs Lab 09/23/13 1921  LIPASE 53   No results found for this basename: AMMONIA,  in the last 168 hours CBC:  Recent Labs Lab 09/23/13 0958  WBC 4.3  NEUTROABS 1.9  HGB 11.3*  HCT 34.7*  MCV 83.6  PLT 204   Cardiac Enzymes:  Recent Labs Lab 09/23/13 0958 09/23/13 1525 09/23/13 2133  TROPONINI <0.30 <0.30 <0.30   BNP: BNP (last 3 results) No results found for this basename: PROBNP,  in the last 8760 hours CBG:  Recent Labs Lab 09/23/13 1313 09/23/13 1645 09/23/13 2035 09/24/13 0735 09/24/13 1215  GLUCAP 261* 330* 170* 174* 277*       Signed:  BLACK,KAREN M  [facility] 09/24/2013, 1:16 PM

## 2013-09-24 NOTE — Progress Notes (Signed)
Stress Lab Nurses Notes - [facility] 09/24/2013 Reason for doing test: Chest Pain Type of test: Stress Echo Nurse performing test: Gerrit Halls, RN Nuclear Medicine Tech: Not Applicable Echo Tech: Jamison Neighbor MD performing test: Jefm Bryant MD: NPCP Test explained and consent signed: yes IV started: Saline lock from floor Symptoms: Fatigue & Dizziness when stopped Treatment/Intervention: None Reason test stopped: fatigue After recovery IV was: No redness or edema Patient to return to [facility] [facility]. Med at : NA Patient discharged: Transported back to room 340 via wc Patient's Condition upon discharge was: stable Comments: During test peak BP 201/70 & HR 151 .  Recovery BP 158/99 & HR 75 .  Symptoms resolved in recovery. Geanie Cooley T

## 2013-09-24 NOTE — Progress Notes (Signed)
Negative stress echo with low risk Duke treadmill score. Chest pain appears to be non-cardiac. No further cardiac testing planned at this time, will sign off of inpatient care.    Carlyle Dolly MD

## 2013-09-24 NOTE — Progress Notes (Signed)
Patient ID: Eddie Watson, male   DOB: 03/02/1956, 58 y.o.   MRN: [id number]    Subjective:    No chest pain overnight  Objective:   Temp:  [97.9 F (36.6 C)-98.4 F (36.9 C)] 97.9 F (36.6 C) (06/18 0449) Pulse Rate:  [56-93] 56 (06/18 0449) Resp:  [14-20] 20 (06/18 0449) BP: (129-161)/(76-97) 132/77 mmHg (06/18 0449) SpO2:  [98 %-100 %] 100 % (06/18 0449) Weight:  [152 lb 12.8 oz (69.31 kg)-162 lb (73.483 kg)] 152 lb 12.8 oz (69.31 kg) (06/18 0500) Last BM Date: 09/23/13  Filed Weights   09/23/13 0953 09/24/13 0500  Weight: 162 lb (73.483 kg) 152 lb 12.8 oz (69.31 kg)    Intake/Output Summary (Last 24 hours) at 09/24/13 0903 Last data filed at 09/24/13 0636  Gross per 24 hour  Intake    243 ml  Output   1525 ml  Net  -1282 ml    Telemetry: sinus bradycardia  Exam:  General: NAD  Resp:CTAB  Cardiac:RRR, no m/r/g, no JVD  GI: soft, NT, ND  MSK:no LE edema  Neuro: no focal deficits  Psych: appropriate affect  Lab Results:  Basic Metabolic Panel:  Recent Labs Lab 09/23/13 0958 09/24/13 0545  NA 140 137  K 4.3 4.2  CL 102 99  CO2 28 27  GLUCOSE 332* 190*  BUN 17 19  CREATININE 0.88 0.82  CALCIUM 9.0 8.9    Liver Function Tests:  Recent Labs Lab 09/23/13 0958 09/24/13 0545  AST 44* 38*  ALT 57* 55*  ALKPHOS 106 133*  BILITOT 0.4 0.3  PROT 7.1 7.2  ALBUMIN 3.3* 3.2*    CBC:  Recent Labs Lab 09/23/13 0958  WBC 4.3  HGB 11.3*  HCT 34.7*  MCV 83.6  PLT 204    Cardiac Enzymes:  Recent Labs Lab 09/23/13 0958 09/23/13 1525 09/23/13 2133  TROPONINI <0.30 <0.30 <0.30    BNP: No results found for this basename: PROBNP,  in the last 8760 hours  Coagulation: No results found for this basename: INR,  in the last 168 hours  ECG:   Medications:   Scheduled Medications: . aspirin EC  325 mg Oral Daily  . benazepril  10 mg Oral Daily  . enoxaparin (LOVENOX) injection  40 mg Subcutaneous Daily  . insulin aspart  0-15 Units  Subcutaneous TID WC  . insulin aspart  0-5 Units Subcutaneous QHS  . sodium chloride  3 mL Intravenous Q12H     Infusions:     PRN Medications:  acetaminophen, acetaminophen, alum & mag hydroxide-simeth, HYDROcodone-acetaminophen, loperamide, morphine injection, nitroGLYCERIN, ondansetron (ZOFRAN) IV, ondansetron     Assessment/Plan    1. Chest pain - somewhat atypical symptoms, however he is high risk given his DM and could be experiencing atypical angina. No evidence of ACS by cardiac enzymes or EKG.  - plan for echo and stress echo today       Carlyle Dolly, M.D., F.A.C.C.

## 2013-09-24 NOTE — Progress Notes (Signed)
Patient being discharged home with prescriptions. IV cath removed and intact. No pain/swelling at site. Verbalizes understanding. No c/o pain at this time. Patient escorted out via Special educational needs teacher.

## 2014-10-23 ENCOUNTER — Emergency Department (HOSPITAL_COMMUNITY)

## 2014-10-23 ENCOUNTER — Emergency Department (HOSPITAL_COMMUNITY)
Admission: EM | Admit: 2014-10-23 | Discharge: 2014-10-23 | Disposition: A | Discharge status: Home / Self Care | Attending: Emergency Medicine | Admitting: Emergency Medicine

## 2014-10-23 ENCOUNTER — Encounter (HOSPITAL_COMMUNITY): Payer: Self-pay | Admitting: *Deleted

## 2014-10-23 DIAGNOSIS — R0602 Shortness of breath: Secondary | ICD-10-CM | POA: Diagnosis not present

## 2014-10-23 DIAGNOSIS — R079 Chest pain, unspecified: Secondary | ICD-10-CM | POA: Insufficient documentation

## 2014-10-23 DIAGNOSIS — Z880 Allergy status to penicillin: Secondary | ICD-10-CM | POA: Insufficient documentation

## 2014-10-23 DIAGNOSIS — E1165 Type 2 diabetes mellitus with hyperglycemia: Secondary | ICD-10-CM | POA: Insufficient documentation

## 2014-10-23 DIAGNOSIS — Z87891 Personal history of nicotine dependence: Secondary | ICD-10-CM | POA: Diagnosis not present

## 2014-10-23 DIAGNOSIS — Z79899 Other long term (current) drug therapy: Secondary | ICD-10-CM | POA: Insufficient documentation

## 2014-10-23 DIAGNOSIS — Z8719 Personal history of other diseases of the digestive system: Secondary | ICD-10-CM | POA: Diagnosis not present

## 2014-10-23 DIAGNOSIS — R739 Hyperglycemia, unspecified: Secondary | ICD-10-CM

## 2014-10-23 LAB — CBC WITH DIFFERENTIAL/PLATELET
BASOS PCT: 1 % (ref 0–1)
Basophils Absolute: 0 10*3/uL (ref 0.0–0.1)
EOS ABS: 0.3 10*3/uL (ref 0.0–0.7)
Eosinophils Relative: 7 % — ABNORMAL HIGH (ref 0–5)
HCT: 33.4 % — ABNORMAL LOW (ref 39.0–52.0)
HEMOGLOBIN: 11.1 g/dL — AB (ref 13.0–17.0)
Lymphocytes Relative: 44 % (ref 12–46)
Lymphs Abs: 2 10*3/uL (ref 0.7–4.0)
MCH: 27.5 pg (ref 26.0–34.0)
MCHC: 33.2 g/dL (ref 30.0–36.0)
MCV: 82.7 fL (ref 78.0–100.0)
MONOS PCT: 7 % (ref 3–12)
Monocytes Absolute: 0.3 10*3/uL (ref 0.1–1.0)
NEUTROS ABS: 1.9 10*3/uL (ref 1.7–7.7)
NEUTROS PCT: 41 % — AB (ref 43–77)
PLATELETS: 203 10*3/uL (ref 150–400)
RBC: 4.04 MIL/uL — ABNORMAL LOW (ref 4.22–5.81)
RDW: 11.5 % (ref 11.5–15.5)
WBC: 4.6 10*3/uL (ref 4.0–10.5)

## 2014-10-23 LAB — BASIC METABOLIC PANEL
Anion gap: 8 (ref 5–15)
BUN: 20 mg/dL (ref 6–20)
CALCIUM: 8.3 mg/dL — AB (ref 8.9–10.3)
CO2: 26 mmol/L (ref 22–32)
Chloride: 101 mmol/L (ref 101–111)
Creatinine, Ser: 1.13 mg/dL (ref 0.61–1.24)
Glucose, Bld: 371 mg/dL — ABNORMAL HIGH (ref 65–99)
Potassium: 3.8 mmol/L (ref 3.5–5.1)
Sodium: 135 mmol/L (ref 135–145)

## 2014-10-23 LAB — TROPONIN I: Troponin I: <0.03 ng/mL (ref <0.031)

## 2014-10-23 MED ORDER — IBUPROFEN 800 MG PO TABS
800.0000 mg | ORAL_TABLET | Freq: Once | ORAL | Status: AC
  Administered 2014-10-23: 800 mg via ORAL
  Filled 2014-10-23: qty 1

## 2014-10-23 MED ORDER — ACETAMINOPHEN 500 MG PO TABS
1000.0000 mg | ORAL_TABLET | Freq: Once | ORAL | Status: AC
  Administered 2014-10-23: 1000 mg via ORAL
  Filled 2014-10-23: qty 2

## 2014-10-23 NOTE — ED Notes (Signed)
Pt given one nitro and 324 aspirin by ems with no change in pain,

## 2014-10-23 NOTE — ED Notes (Signed)
Gave discharge orders to Nurse Tammi Klippel in [place], [place], patient to return to [facility], in [place], [place].

## 2014-10-23 NOTE — ED Notes (Signed)
Pt c/o left side chest pain that radiates down to left leg causing numbness to left thigh area. Denies any sob, n/v, diaphoresis, pain starts at left lower ribcage area, described as a "hurt" that increases when he talks, .

## 2014-10-23 NOTE — ED Provider Notes (Signed)
CSN: [id number]     Arrival date & time 10/23/14  2022 History  This chart was scribed for Nat Christen, MD by Julien Nordmann, ED Scribe. This patient was seen in room APA16A/APA16A and the patient's care was started at 8:35 PM.    Chief Complaint  Patient presents with  . Chest Pain      The history is provided by the patient. No language interpreter was used.   HPI Comments: Eddie Watson is a 59 y.o. male who presents to the Emergency Department complaining of left inferior lateral chest pain onset about 1.5 hours ago. Pt states having associated shortness of breath. Pt notes a sharp shooting pain in his side. He notes the pain sto his left leg. Pt reports walking outside and the pain hit him all of a sudden. He notes this has never happened before. Pt is a non smoker. Pt denies vomiting, dyspnea, nausea, diaphoresis, and hx of heart problems.  Past Medical History  Diagnosis Date  . Diabetes mellitus without complication   . Chronic diarrhea    Past Surgical History  Procedure Laterality Date  . Joint replacement Right      x 2  . Cholecystectomy     No family history on file. History  Substance Use Topics  . Smoking status: Former Research scientist (life sciences)  . Smokeless tobacco: Never Used  . Alcohol Use: No    Review of Systems  A complete 10 system review of systems was obtained and all systems are negative except as noted in the HPI and PMH.    Allergies  Penicillins and Tomato  Home Medications   Prior to Admission medications   Medication Sig Start Date End Date Taking? Authorizing Provider  loperamide (IMODIUM) 2 MG capsule Take 2 mg by mouth as needed for diarrhea or loose stools.   Yes Historical Provider, MD  glyBURIDE (DIABETA) 5 MG tablet Take 1 tablet (5 mg total) by mouth 2 (two) times daily with a meal. 09/24/13   Radene Gunning, NP   Triage vitals: BP 153/88 mmHg  Pulse 84  Resp 20  Ht 6' (1.829 m)  Wt 160 lb (72.576 kg)  BMI 21.70 kg/m2  SpO2 100% Physical Exam   Constitutional: He is oriented to person, place, and time. He appears well-developed and well-nourished.  HENT:  Head: Normocephalic and atraumatic.  Eyes: Conjunctivae and EOM are normal. Pupils are equal, round, and reactive to light.  Neck: Normal range of motion. Neck supple.  Cardiovascular: Normal rate and regular rhythm.   Pulmonary/Chest: Effort normal and breath sounds normal.  Abdominal: Soft. Bowel sounds are normal.  Musculoskeletal: Normal range of motion.  Tender left inferior lateral chest wall  Neurological: He is alert and oriented to person, place, and time.  Skin: Skin is warm and dry.  Psychiatric: He has a normal mood and affect. His behavior is normal.  Nursing note and vitals reviewed.   ED Course  Procedures  DIAGNOSTIC STUDIES: Oxygen Saturation is 100% on RA, normal by my interpretation.  COORDINATION OF CARE:  8:37 PM Discussed treatment plan which includes x-ray, EKG and blood work with pt at bedside and pt agreed to plan.  Labs Review Labs Reviewed  CBC WITH DIFFERENTIAL/PLATELET - Abnormal; Notable for the following:    RBC 4.04 (*)    Hemoglobin 11.1 (*)    HCT 33.4 (*)    Neutrophils Relative % 41 (*)    Eosinophils Relative 7 (*)    All other components within normal  limits  BASIC METABOLIC PANEL - Abnormal; Notable for the following:    Glucose, Bld 371 (*)    Calcium 8.3 (*)    All other components within normal limits  TROPONIN I    Imaging Review Dg Chest 2 View  10/23/2014   CLINICAL DATA:  A 59 year old male with left chest pain  EXAM: CHEST  2 VIEW  COMPARISON:  Chest radiograph dated 09/23/2013  FINDINGS: The heart size and mediastinal contours are within normal limits. Both lungs are clear. The visualized skeletal structures are unremarkable.  IMPRESSION: No active cardiopulmonary disease.   Electronically Signed   By: Anner Crete M.D.   On: 10/23/2014 21:21     EKG Interpretation None      Date: 10/23/2014  Rate: 85   Rhythm: normal sinus rhythm  QRS Axis: normal  Intervals: normal  ST/T Wave abnormalities: normal  Conduction Disutrbances: prolonged QT  Narrative Interpretation: unremarkable    MDM   Final diagnoses:  Chest pain, unspecified chest pain type  Hyperglycemia   Patient is in no acute distress. EKG, chest x-ray, troponin all negative. Glucose elevated at 371. Patient had been on diabetes medicine the past. I recommend he be restarted on his medication.  I personally performed the services described in this documentation, which was scribed in my presence. The recorded information has been reviewed and is accurate.   Nat Christen, MD 10/23/14 2234

## 2014-10-23 NOTE — Discharge Instructions (Signed)
Glucose was elevated today at 371.   Chest x-ray, EKG, troponin all normal.    Tylenol or ibuprofen for pain.

## 2015-03-19 ENCOUNTER — Encounter (HOSPITAL_COMMUNITY): Payer: Self-pay | Admitting: Emergency Medicine

## 2015-03-19 ENCOUNTER — Emergency Department (HOSPITAL_COMMUNITY)
Admission: EM | Admit: 2015-03-19 | Discharge: 2015-03-20 | Disposition: A | Discharge status: Home / Self Care | Attending: Emergency Medicine | Admitting: Emergency Medicine

## 2015-03-19 DIAGNOSIS — Z880 Allergy status to penicillin: Secondary | ICD-10-CM | POA: Insufficient documentation

## 2015-03-19 DIAGNOSIS — Z79899 Other long term (current) drug therapy: Secondary | ICD-10-CM | POA: Insufficient documentation

## 2015-03-19 DIAGNOSIS — E119 Type 2 diabetes mellitus without complications: Secondary | ICD-10-CM | POA: Insufficient documentation

## 2015-03-19 DIAGNOSIS — Z87891 Personal history of nicotine dependence: Secondary | ICD-10-CM | POA: Diagnosis not present

## 2015-03-19 DIAGNOSIS — Z7951 Long term (current) use of inhaled steroids: Secondary | ICD-10-CM | POA: Insufficient documentation

## 2015-03-19 DIAGNOSIS — R569 Unspecified convulsions: Secondary | ICD-10-CM | POA: Insufficient documentation

## 2015-03-19 HISTORY — DX: Unspecified convulsions: R56.9

## 2015-03-19 LAB — RAPID URINE DRUG SCREEN, HOSP PERFORMED
Amphetamines: NOT DETECTED
BENZODIAZEPINES: NOT DETECTED
Barbiturates: NOT DETECTED
Cocaine: NOT DETECTED
Opiates: NOT DETECTED
TETRAHYDROCANNABINOL: NOT DETECTED

## 2015-03-19 LAB — COMPREHENSIVE METABOLIC PANEL
ALBUMIN: 3.2 g/dL — AB (ref 3.5–5.0)
ALT: 50 U/L (ref 17–63)
ANION GAP: 6 (ref 5–15)
AST: 38 U/L (ref 15–41)
Alkaline Phosphatase: 71 U/L (ref 38–126)
BILIRUBIN TOTAL: 0.4 mg/dL (ref 0.3–1.2)
BUN: 28 mg/dL — AB (ref 6–20)
CO2: 28 mmol/L (ref 22–32)
Calcium: 8.7 mg/dL — ABNORMAL LOW (ref 8.9–10.3)
Chloride: 101 mmol/L (ref 101–111)
Creatinine, Ser: 1.15 mg/dL (ref 0.61–1.24)
GFR calc Af Amer: >60 mL/min (ref >60)
GFR calc non Af Amer: >60 mL/min (ref >60)
Glucose, Bld: 200 mg/dL — ABNORMAL HIGH (ref 65–99)
POTASSIUM: 4.2 mmol/L (ref 3.5–5.1)
Sodium: 135 mmol/L (ref 135–145)
Total Protein: 6.6 g/dL (ref 6.5–8.1)

## 2015-03-19 LAB — PHENYTOIN LEVEL, TOTAL

## 2015-03-19 LAB — MAGNESIUM: MAGNESIUM: 1.5 mg/dL — AB (ref 1.7–2.4)

## 2015-03-19 MED ORDER — MAGNESIUM SULFATE 2 GM/50ML IV SOLN
2.0000 g | Freq: Once | INTRAVENOUS | Status: AC
  Administered 2015-03-19: 2 g via INTRAVENOUS
  Filled 2015-03-19: qty 50

## 2015-03-19 MED ORDER — KETOROLAC TROMETHAMINE 60 MG/2ML IM SOLN
30.0000 mg | Freq: Once | INTRAMUSCULAR | Status: AC
  Administered 2015-03-19: 30 mg via INTRAMUSCULAR
  Filled 2015-03-19: qty 2

## 2015-03-19 NOTE — ED Provider Notes (Signed)
CSN: [id number]     Arrival date & time 03/19/15  2120 History  By signing my name below, I, [name], attest that this documentation has been prepared under the direction and in the presence of Merrily Pew, MD. Electronically Signed: [name], Scribe. 03/19/2015. 10:00 PM.   Chief Complaint  Patient presents with  . Seizures    The history is provided by the patient. No language interpreter was used.    HPI Comments: Eddie Watson is a 59 y.o. male with a h/o seizures, DM, and neuropathy who presents to the Emergency Department complaining of incidence of a seizure earlier this evening while at school. Pt reports he felt dizzy and his body locked out before the seizure. He denies head injury during seizure and notes that people were around him to ease him down to the floor. Pt notes this is his third or fourth seizure since September and states that his seizure today was similar to his past seizures which usually last two or three minutes. Pt also reports a mild HA and numbness and cramping sensations in his right leg which usually do not occur until late at night but began earlier than normal today. Pt is on medication for his seizures. He states that he now feels relatively normal. He denies recent illness, nausea, and vomiting.  Past Medical History  Diagnosis Date  . Diabetes mellitus without complication ([place])   . Chronic diarrhea   . Seizures [facility])    Past Surgical History  Procedure Laterality Date  . Joint replacement Right      x 2  . Cholecystectomy     History reviewed. No pertinent family history. Social History  Substance Use Topics  . Smoking status: Former Research scientist (life sciences)  . Smokeless tobacco: Never Used  . Alcohol Use: No    Review of Systems  Gastrointestinal: Negative for nausea and vomiting.  Neurological: Positive for seizures, numbness and headaches.    Allergies  Penicillins and Tomato  Home Medications   Prior to Admission  medications   Medication Sig Start Date End Date Taking? Authorizing Provider  lisinopril (PRINIVIL,ZESTRIL) 20 MG tablet Take 20 mg by mouth daily.   Yes Historical Provider, MD  loperamide (IMODIUM) 2 MG capsule Take 2 mg by mouth as needed for diarrhea or loose stools.   Yes Historical Provider, MD  magnesium oxide (MAG-OX) 400 MG tablet Take 400 mg by mouth daily.   Yes Historical Provider, MD  metFORMIN (GLUCOPHAGE) 500 MG tablet Take 500 mg by mouth 2 (two) times daily with a meal.   Yes Historical Provider, MD  Multiple Vitamin (MULTIVITAMIN WITH MINERALS) TABS tablet Take 1 tablet by mouth daily.   Yes Historical Provider, MD  phenytoin (DILANTIN) 100 MG ER capsule Take 100 mg by mouth 3 (three) times daily.   Yes Historical Provider, MD  triamcinolone (NASACORT ALLERGY 24HR) 55 MCG/ACT AERO nasal inhaler Place 2 sprays into the nose daily.   Yes Historical Provider, MD   BP 178/98 mmHg  Pulse 71  Temp(Src) 98.3 F (36.8 C) (Oral)  Resp 20  SpO2 100% Physical Exam  Constitutional: He is oriented to person, place, and time. He appears well-developed and well-nourished. No distress.  HENT:  Head: Normocephalic.  Eyes: Conjunctivae are normal.  Cardiovascular: Normal rate, regular rhythm and normal heart sounds.   Pulmonary/Chest: Effort normal and breath sounds normal.  Abdominal: He exhibits no distension.  Neurological: He is alert and oriented to person, place, and time.  Skin:  Skin is warm and dry.  Psychiatric: He has a normal mood and affect.  Nursing note and vitals reviewed.   ED Course  Procedures  DIAGNOSTIC STUDIES:    Oxygen Saturation is 100% on RA, normal by my interpretation.   COORDINATION OF CARE:  9:44 PM Will order comprehensive metabolic panel, urine rapid drug screen, phenytoin level stat, and magnesium stat. Will administer pain medication in the ED. Discussed treatment plan with pt at bedside and pt agreed to plan.    Labs Review Labs Reviewed   COMPREHENSIVE METABOLIC PANEL - Abnormal; Notable for the following:    Glucose, Bld 200 (*)    BUN 28 (*)    Calcium 8.7 (*)    Albumin 3.2 (*)    All other components within normal limits  PHENYTOIN LEVEL, TOTAL - Abnormal; Notable for the following:    Phenytoin Lvl <2.5 (*)    All other components within normal limits  MAGNESIUM - Abnormal; Notable for the following:    Magnesium 1.5 (*)    All other components within normal limits  URINE RAPID DRUG SCREEN, HOSP PERFORMED    Imaging Review No results found. I have personally reviewed and evaluated these images and lab results as part of my medical decision-making.   EKG Interpretation   Date/Time:  Saturday March 19 2015 21:27:30 EST Ventricular Rate:  62 PR Interval:  145 QRS Duration: 91 QT Interval:  417 QTC Calculation: 423 R Axis:   71 Text Interpretation:  Sinus rhythm Consider left ventricular hypertrophy  Nonspecific T abnormalities, lateral leads ED PHYSICIAN INTERPRETATION  AVAILABLE IN [facility] Confirmed by TEST, Record ([id number]) on  03/20/2015 7:38:07 AM      MDM   Final diagnoses:  Seizure ([place])   Here with likely breakthrough seizure 2/2 hypomagnesemia v noncompliance, no changes on ECG from it. repleted here. At baseline. Phenytoin level low, recommended ensuring he was taking meds. No neuro changes to suggest more worrisome causes.   I personally performed the services described in this documentation, which was scribed in my presence. The recorded information has been reviewed and is accurate.       Merrily Pew, MD 03/20/15 1754

## 2015-03-19 NOTE — ED Notes (Addendum)
Pt states he began "feeling weird" today and was sat down. Pt states he had dry mouth and dizziness accompanied with visual changes at this time. Pt denies any dizziness at this time, but states dry mouth. No one witnessed seizure activity and no incontinence noted per EMS. Per EMS pt has had 3 seizures since September

## 2015-03-20 NOTE — ED Notes (Addendum)
[facility] [phone] to give report to [place], [place]. Pt is ready to be discharged and be sent back to [facility].

## 2018-05-14 ENCOUNTER — Inpatient Hospital Stay (HOSPITAL_COMMUNITY)
Admission: EM | Admit: 2018-05-14 | Discharge: 2018-05-17 | DRG: 871 | Disposition: A | Discharge status: Skilled Nursing Facility | Payer: Medicaid Other | Source: Skilled Nursing Facility | Attending: Internal Medicine | Admitting: Internal Medicine

## 2018-05-14 ENCOUNTER — Other Ambulatory Visit: Payer: Self-pay

## 2018-05-14 ENCOUNTER — Encounter (HOSPITAL_COMMUNITY): Payer: Self-pay | Admitting: Emergency Medicine

## 2018-05-14 ENCOUNTER — Emergency Department (HOSPITAL_COMMUNITY): Payer: Medicaid Other

## 2018-05-14 DIAGNOSIS — R7881 Bacteremia: Secondary | ICD-10-CM | POA: Diagnosis not present

## 2018-05-14 DIAGNOSIS — G40909 Epilepsy, unspecified, not intractable, without status epilepticus: Secondary | ICD-10-CM | POA: Diagnosis present

## 2018-05-14 DIAGNOSIS — J150 Pneumonia due to Klebsiella pneumoniae: Secondary | ICD-10-CM | POA: Diagnosis present

## 2018-05-14 DIAGNOSIS — Z7984 Long term (current) use of oral hypoglycemic drugs: Secondary | ICD-10-CM | POA: Diagnosis not present

## 2018-05-14 DIAGNOSIS — I4581 Long QT syndrome: Secondary | ICD-10-CM | POA: Diagnosis present

## 2018-05-14 DIAGNOSIS — D631 Anemia in chronic kidney disease: Secondary | ICD-10-CM | POA: Diagnosis present

## 2018-05-14 DIAGNOSIS — R569 Unspecified convulsions: Secondary | ICD-10-CM | POA: Diagnosis not present

## 2018-05-14 DIAGNOSIS — Z87891 Personal history of nicotine dependence: Secondary | ICD-10-CM

## 2018-05-14 DIAGNOSIS — Z8619 Personal history of other infectious and parasitic diseases: Secondary | ICD-10-CM | POA: Diagnosis not present

## 2018-05-14 DIAGNOSIS — R0600 Dyspnea, unspecified: Secondary | ICD-10-CM | POA: Diagnosis present

## 2018-05-14 DIAGNOSIS — Z880 Allergy status to penicillin: Secondary | ICD-10-CM

## 2018-05-14 DIAGNOSIS — B961 Klebsiella pneumoniae [K. pneumoniae] as the cause of diseases classified elsewhere: Secondary | ICD-10-CM | POA: Diagnosis not present

## 2018-05-14 DIAGNOSIS — Z9660 Presence of unspecified orthopedic joint implant: Secondary | ICD-10-CM | POA: Diagnosis present

## 2018-05-14 DIAGNOSIS — A419 Sepsis, unspecified organism: Secondary | ICD-10-CM | POA: Diagnosis present

## 2018-05-14 DIAGNOSIS — N2581 Secondary hyperparathyroidism of renal origin: Secondary | ICD-10-CM | POA: Diagnosis present

## 2018-05-14 DIAGNOSIS — N186 End stage renal disease: Secondary | ICD-10-CM | POA: Diagnosis not present

## 2018-05-14 DIAGNOSIS — Y95 Nosocomial condition: Secondary | ICD-10-CM | POA: Diagnosis present

## 2018-05-14 DIAGNOSIS — Z9049 Acquired absence of other specified parts of digestive tract: Secondary | ICD-10-CM | POA: Diagnosis not present

## 2018-05-14 DIAGNOSIS — Z992 Dependence on renal dialysis: Secondary | ICD-10-CM

## 2018-05-14 DIAGNOSIS — K219 Gastro-esophageal reflux disease without esophagitis: Secondary | ICD-10-CM | POA: Diagnosis present

## 2018-05-14 DIAGNOSIS — E1122 Type 2 diabetes mellitus with diabetic chronic kidney disease: Secondary | ICD-10-CM | POA: Diagnosis present

## 2018-05-14 DIAGNOSIS — E119 Type 2 diabetes mellitus without complications: Secondary | ICD-10-CM

## 2018-05-14 DIAGNOSIS — I10 Essential (primary) hypertension: Secondary | ICD-10-CM | POA: Diagnosis present

## 2018-05-14 DIAGNOSIS — J189 Pneumonia, unspecified organism: Secondary | ICD-10-CM | POA: Diagnosis not present

## 2018-05-14 DIAGNOSIS — I132 Hypertensive heart and chronic kidney disease with heart failure and with stage 5 chronic kidney disease, or end stage renal disease: Secondary | ICD-10-CM | POA: Diagnosis present

## 2018-05-14 DIAGNOSIS — I509 Heart failure, unspecified: Secondary | ICD-10-CM | POA: Diagnosis present

## 2018-05-14 DIAGNOSIS — Z91018 Allergy to other foods: Secondary | ICD-10-CM | POA: Diagnosis not present

## 2018-05-14 DIAGNOSIS — K529 Noninfective gastroenteritis and colitis, unspecified: Secondary | ICD-10-CM | POA: Diagnosis not present

## 2018-05-14 DIAGNOSIS — Z79899 Other long term (current) drug therapy: Secondary | ICD-10-CM | POA: Diagnosis not present

## 2018-05-14 DIAGNOSIS — A4159 Other Gram-negative sepsis: Secondary | ICD-10-CM | POA: Diagnosis present

## 2018-05-14 LAB — COMPREHENSIVE METABOLIC PANEL
ALT: 26 U/L (ref 0–44)
AST: 33 U/L (ref 15–41)
Albumin: 2.8 g/dL — ABNORMAL LOW (ref 3.5–5.0)
Alkaline Phosphatase: 116 U/L (ref 38–126)
Anion gap: 13 (ref 5–15)
BUN: 13 mg/dL (ref 8–23)
CO2: 27 mmol/L (ref 22–32)
Calcium: 7.7 mg/dL — ABNORMAL LOW (ref 8.9–10.3)
Chloride: 97 mmol/L — ABNORMAL LOW (ref 98–111)
Creatinine, Ser: 3.24 mg/dL — ABNORMAL HIGH (ref 0.61–1.24)
GFR calc Af Amer: 23 mL/min — ABNORMAL LOW (ref >60)
GFR calc non Af Amer: 20 mL/min — ABNORMAL LOW (ref >60)
Glucose, Bld: 96 mg/dL (ref 70–99)
Potassium: 3.7 mmol/L (ref 3.5–5.1)
Sodium: 137 mmol/L (ref 135–145)
Total Bilirubin: 0.5 mg/dL (ref 0.3–1.2)
Total Protein: 6.8 g/dL (ref 6.5–8.1)

## 2018-05-14 LAB — CBC WITH DIFFERENTIAL/PLATELET
Abs Immature Granulocytes: 0.06 10*3/uL (ref 0.00–0.07)
Basophils Absolute: 0 10*3/uL (ref 0.0–0.1)
Basophils Relative: 0 %
Eosinophils Absolute: 0.2 10*3/uL (ref 0.0–0.5)
Eosinophils Relative: 2 %
HCT: 24.7 % — ABNORMAL LOW (ref 39.0–52.0)
Hemoglobin: 7.4 g/dL — ABNORMAL LOW (ref 13.0–17.0)
Immature Granulocytes: 1 %
Lymphocytes Relative: 3 %
Lymphs Abs: 0.3 10*3/uL — ABNORMAL LOW (ref 0.7–4.0)
MCH: 26.3 pg (ref 26.0–34.0)
MCHC: 30 g/dL (ref 30.0–36.0)
MCV: 87.9 fL (ref 80.0–100.0)
Monocytes Absolute: 0.1 10*3/uL (ref 0.1–1.0)
Monocytes Relative: 1 %
Neutro Abs: 10.2 10*3/uL — ABNORMAL HIGH (ref 1.7–7.7)
Neutrophils Relative %: 93 %
PLATELETS: 297 10*3/uL (ref 150–400)
RBC: 2.81 MIL/uL — ABNORMAL LOW (ref 4.22–5.81)
RDW: 17.4 % — ABNORMAL HIGH (ref 11.5–15.5)
WBC: 10.8 10*3/uL — AB (ref 4.0–10.5)
nRBC: 0 % (ref 0.0–0.2)

## 2018-05-14 LAB — POCT I-STAT EG7
Acid-Base Excess: 9 mmol/L — ABNORMAL HIGH (ref 0.0–2.0)
Bicarbonate: 32.2 mmol/L — ABNORMAL HIGH (ref 20.0–28.0)
Calcium, Ion: 1 mmol/L — ABNORMAL LOW (ref 1.15–1.40)
HCT: 25 % — ABNORMAL LOW (ref 39.0–52.0)
Hemoglobin: 8.5 g/dL — ABNORMAL LOW (ref 13.0–17.0)
O2 Saturation: 75 %
PCO2 VEN: 44.6 mmHg (ref 44.0–60.0)
Patient temperature: 103.4
Potassium: 3.5 mmol/L (ref 3.5–5.1)
Sodium: 135 mmol/L (ref 135–145)
TCO2: 33 mmol/L — ABNORMAL HIGH (ref 22–32)
pH, Ven: 7.477 — ABNORMAL HIGH (ref 7.250–7.430)
pO2, Ven: 43 mmHg (ref 32.0–45.0)

## 2018-05-14 LAB — LACTIC ACID, PLASMA: Lactic Acid, Venous: 1.7 mmol/L (ref 0.5–1.9)

## 2018-05-14 LAB — GLUCOSE, CAPILLARY
Glucose-Capillary: 146 mg/dL — ABNORMAL HIGH (ref 70–99)
Glucose-Capillary: 156 mg/dL — ABNORMAL HIGH (ref 70–99)

## 2018-05-14 LAB — INFLUENZA PANEL BY PCR (TYPE A & B)
Influenza A By PCR: NEGATIVE
Influenza B By PCR: NEGATIVE

## 2018-05-14 MED ORDER — DEXTROSE 5 % IV SOLN
0.5000 g | Freq: Two times a day (BID) | INTRAVENOUS | Status: DC
  Administered 2018-05-14 – 2018-05-16 (×5): 0.5 g via INTRAVENOUS
  Filled 2018-05-14 (×7): qty 0.5

## 2018-05-14 MED ORDER — ALBUTEROL SULFATE (2.5 MG/3ML) 0.083% IN NEBU: 2.5000 mg | INHALATION_SOLUTION | RESPIRATORY_TRACT | Status: DC | PRN

## 2018-05-14 MED ORDER — ADULT MULTIVITAMIN W/MINERALS CH: 1.0000 | ORAL_TABLET | Freq: Every day | ORAL | Status: DC

## 2018-05-14 MED ORDER — HEPARIN SODIUM (PORCINE) 5000 UNIT/ML IJ SOLN
5000.0000 [IU] | Freq: Three times a day (TID) | INTRAMUSCULAR | Status: DC
  Filled 2018-05-14 (×2): qty 1

## 2018-05-14 MED ORDER — PHENYTOIN SODIUM EXTENDED 100 MG PO CAPS
100.0000 mg | ORAL_CAPSULE | Freq: Three times a day (TID) | ORAL | Status: DC
  Filled 2018-05-14: qty 1

## 2018-05-14 MED ORDER — ACETAMINOPHEN 325 MG PO TABS
650.0000 mg | ORAL_TABLET | Freq: Four times a day (QID) | ORAL | Status: DC | PRN
  Administered 2018-05-16: 650 mg via ORAL
  Filled 2018-05-14: qty 2

## 2018-05-14 MED ORDER — ALBUTEROL SULFATE (2.5 MG/3ML) 0.083% IN NEBU
5.0000 mg | INHALATION_SOLUTION | Freq: Once | RESPIRATORY_TRACT | Status: AC
  Administered 2018-05-14: 5 mg via RESPIRATORY_TRACT
  Filled 2018-05-14: qty 6

## 2018-05-14 MED ORDER — VANCOMYCIN HCL IN DEXTROSE 1-5 GM/200ML-% IV SOLN: 1000.0000 mg | Freq: Once | INTRAVENOUS | Status: DC

## 2018-05-14 MED ORDER — LOPERAMIDE HCL 2 MG PO CAPS
2.0000 mg | ORAL_CAPSULE | ORAL | Status: DC | PRN
  Administered 2018-05-15 – 2018-05-17 (×7): 2 mg via ORAL
  Filled 2018-05-14 (×7): qty 1

## 2018-05-14 MED ORDER — RENA-VITE PO TABS
1.0000 | ORAL_TABLET | Freq: Every day | ORAL | Status: DC
  Administered 2018-05-15: 1 via ORAL
  Filled 2018-05-14 (×2): qty 1

## 2018-05-14 MED ORDER — LISINOPRIL 20 MG PO TABS: 20.0000 mg | ORAL_TABLET | Freq: Every day | ORAL | Status: DC

## 2018-05-14 MED ORDER — VANCOMYCIN HCL 10 G IV SOLR
1750.0000 mg | Freq: Once | INTRAVENOUS | Status: AC
  Administered 2018-05-14: 1750 mg via INTRAVENOUS
  Filled 2018-05-14: qty 1750

## 2018-05-14 MED ORDER — INSULIN ASPART 100 UNIT/ML ~~LOC~~ SOLN
0.0000 [IU] | Freq: Three times a day (TID) | SUBCUTANEOUS | Status: DC
  Administered 2018-05-14: 1 [IU] via SUBCUTANEOUS
  Administered 2018-05-15: 2 [IU] via SUBCUTANEOUS
  Administered 2018-05-15: 3 [IU] via SUBCUTANEOUS
  Administered 2018-05-15 – 2018-05-17 (×2): 2 [IU] via SUBCUTANEOUS

## 2018-05-14 MED ORDER — INSULIN ASPART 100 UNIT/ML ~~LOC~~ SOLN: 0.0000 [IU] | Freq: Every day | SUBCUTANEOUS | Status: DC

## 2018-05-14 MED ORDER — ONDANSETRON HCL 4 MG PO TABS: 4.0000 mg | ORAL_TABLET | Freq: Four times a day (QID) | ORAL | Status: DC | PRN

## 2018-05-14 MED ORDER — ONDANSETRON HCL 4 MG/2ML IJ SOLN: 4.0000 mg | Freq: Four times a day (QID) | INTRAMUSCULAR | Status: DC | PRN

## 2018-05-14 MED ORDER — VANCOMYCIN HCL IN DEXTROSE 750-5 MG/150ML-% IV SOLN
750.0000 mg | INTRAVENOUS | Status: DC
  Filled 2018-05-14: qty 150

## 2018-05-14 MED ORDER — MAGNESIUM OXIDE 400 MG PO TABS: 400.0000 mg | ORAL_TABLET | Freq: Every day | ORAL | Status: DC

## 2018-05-14 MED ORDER — ACETAMINOPHEN 650 MG RE SUPP: 650.0000 mg | Freq: Four times a day (QID) | RECTAL | Status: DC | PRN

## 2018-05-14 MED ORDER — SODIUM CHLORIDE 0.9 % IV SOLN: 2.0000 g | Freq: Once | INTRAVENOUS | Status: DC

## 2018-05-14 NOTE — Progress Notes (Signed)
Pharmacy Antibiotic Note  Eddie Watson is a 63 y.o. male admitted on 05/14/2018 with pneumonia and sepsis.  Pharmacy has been consulted for vancomycin/aztreonam dosing. Tmax/24h 103.4. Estimated weight in the ED 175 lbs per patient. ESRD on HD MWF - tolerated almost full session prior to presentation today (3.5 of 4 hr session). Per patient, he was not currently receiving any antibiotics at HD.  Patient with hx of anaphylaxis to PCN reported - no history of beta lactam use in chart.  Plan: Aztreonam 500mg  IV q12h Vancomycin 1750mg  IV x1; then 750mg  IV qMWF HD Monitor clinical progress, c/s, abx plan/LOT Pre-HD vancomycin level as indicated F/u HD schedule/tolerance inpatient      Temp (24hrs), Avg:103.4 F (39.7 C), Min:103.4 F (39.7 C), Max:103.4 F (39.7 C)  No results for input(s): WBC, CREATININE, LATICACIDVEN, VANCOTROUGH, VANCOPEAK, VANCORANDOM, GENTTROUGH, GENTPEAK, GENTRANDOM, TOBRATROUGH, TOBRAPEAK, TOBRARND, AMIKACINPEAK, AMIKACINTROU, AMIKACIN in the last 168 hours.  CrCl cannot be calculated (Patient's most recent lab result is older than the maximum 21 days allowed.).    Allergies  Allergen Reactions  . Penicillins Anaphylaxis    Has patient had a PCN reaction causing immediate rash, facial/tongue/throat swelling, SOB or lightheadedness with hypotension: Yes Has patient had a PCN reaction causing severe rash involving mucus membranes or skin necrosis: Yes Has patient had a PCN reaction that required hospitalization Yes Has patient had a PCN reaction occurring within the last 10 years:unknown If all of the above answers are "NO", then may proceed with Cephalosporin use.   . Tomato     Face swells up like a basketball    Antimicrobials this admission: 2/5 vancomycin >>  2/5 aztreonam >>   Dose adjustments this admission:   Microbiology results:   Elicia Lamp, PharmD, BCPS Clinical Pharmacist 05/14/2018 11:22 AM

## 2018-05-14 NOTE — ED Triage Notes (Signed)
Pt here from dialysis center , pt did all but 30 mins of his dialysis , no pain just sob

## 2018-05-14 NOTE — H&P (Signed)
History and Physical    Eddie Watson TMA:[id number] DOB: [birth year] DOA: 05/14/2018  PCP: Patient, No Pcp Per  Patient coming from: HD center. He is at a nursing home  I have personally briefly reviewed patient's old medical records available.   Chief Complaint: High fever and shortness of breath while receiving hemodialysis.  HPI: Eddie Watson is a 63 y.o. 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  Patient was initially put on BiPAP and currently remains on room air.  According to the patient, he had no preceding symptoms including fever, chills or cough and cold symptoms today morning.  He went to hemodialysis.  He finished 3-1/2 hours and suddenly started having chills and developed shortness of breath very rapidly.  He started having cough.  His temperature was normal but he felt hot.  He was given Tylenol.  Then transported to the ER.  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  On my evaluation, patient complains of being thirsty, has some dry cough but denies any other symptoms. No recent travel. He is at a nursing home since discharge.  He goes to dialysis Monday Wednesday and Friday. ED Course: Hemodynamically stable.  Temperature 103.4.  WBC count 10.8.  Initially started on BiPAP for respiratory distress, however now weaned off to the room air.  Patient was given vancomycin and aztreonam after obtaining blood cultures.  Chest x-ray shows right lower lobe consolidation and effusion.  Review of Systems: As per HPI otherwise 10 point review of systems negative.    Past Medical History:  Diagnosis Date  .  Chronic diarrhea   . Diabetes mellitus without complication ([place])   . Seizures ([place])     Past Surgical History:  Procedure Laterality Date  . CHOLECYSTECTOMY    . JOINT REPLACEMENT Right     x 2     reports that he has quit smoking. He has never used smokeless tobacco. He reports that he does not drink alcohol or use drugs.  Allergies  Allergen Reactions  . Penicillins Anaphylaxis    Has patient had a PCN reaction causing immediate rash, facial/tongue/throat swelling, SOB or lightheadedness with hypotension: Yes Has patient had a PCN reaction causing severe rash involving mucus membranes or skin necrosis: Yes Has patient had a PCN reaction that required hospitalization Yes Has patient had a PCN reaction occurring within the last 10 years:unknown If all of the above answers are "NO", then may proceed with Cephalosporin use.   . Tomato     Face swells up like a basketball    History reviewed. No pertinent family history.   Prior to Admission medications   Medication Sig Start Date End Date Taking? Authorizing Provider  lisinopril (PRINIVIL,ZESTRIL) 20 MG tablet Take 20 mg by mouth daily.    [provider]  loperamide (IMODIUM) 2 MG capsule Take 2 mg by mouth as needed for diarrhea or loose stools.    [provider]  magnesium oxide (MAG-OX) 400 MG tablet Take 400 mg by mouth daily.    [provider]  metFORMIN (GLUCOPHAGE) 500 MG tablet Take 500 mg by mouth 2 (two) times daily with a meal.  [provider]  Multiple Vitamin (MULTIVITAMIN WITH MINERALS) TABS tablet Take 1 tablet by mouth daily.    [provider]  phenytoin (DILANTIN) 100 MG ER capsule Take 100 mg by mouth 3 (three) times daily.    [provider]  triamcinolone (NASACORT ALLERGY 24HR) 55 MCG/ACT AERO nasal inhaler Place 2 sprays into the nose daily.    [provider]    Physical Exam: Vitals:   05/14/18 1110 05/14/18 1116 05/14/18 1132  BP:  (!) 142/76 (!) 142/76 (!) 158/72  Pulse:  (!) 108 95  Resp: (!) 35  (!) 28  Temp:  (!) 103.4 F (39.7 C)   TempSrc:  Rectal   SpO2: 100% 100% 100%    Constitutional: NAD, calm, comfortable Vitals:   05/14/18 1110 05/14/18 1116 05/14/18 1132  BP: (!) 142/76 (!) 142/76 (!) 158/72  Pulse:  (!) 108 95  Resp: (!) 35  (!) 28  Temp:  (!) 103.4 F (39.7 C)   TempSrc:  Rectal   SpO2: 100% 100% 100%   Eyes: PERRL, lids and conjunctivae normal ENMT: Mucous membranes are moist. Posterior pharynx clear of any exudate or lesions.Normal dentition.  Neck: normal, supple, no masses, no thyromegaly Respiratory: Poor air entry on right base but no added sound.  no wheezing, no crackles. Normal respiratory effort. No accessory muscle use.  Left chest permacath, nontender. Cardiovascular: Regular rate and rhythm, no murmurs / rubs / gallops. No extremity edema. 2+ pedal pulses. No carotid bruits.  Abdomen: no tenderness, no masses palpated. No hepatosplenomegaly. Bowel sounds positive.  Musculoskeletal: no clubbing / cyanosis. No joint deformity upper and lower extremities. Good ROM, no contractures. Normal muscle tone.  Skin: no rashes, lesions, ulcers. No induration Neurologic: CN 2-12 grossly intact. Sensation intact, DTR normal. Strength 5/5 in all 4.  Psychiatric: Normal judgment and insight. Alert and oriented x 3. Normal mood.     Labs on Admission: I have personally reviewed following labs and imaging studies  CBC: Recent Labs  Lab 05/14/18 1138 05/14/18 1148  WBC 10.8*  --   NEUTROABS 10.2*  --   HGB 7.4* 8.5*  HCT 24.7* 25.0*  MCV 87.9  --   PLT 297  --    Basic Metabolic Panel: Recent Labs  Lab 05/14/18 1138 05/14/18 1148  NA 137 135  K 3.7 3.5  CL 97*  --   CO2 27  --   GLUCOSE 96  --   BUN 13  --   CREATININE 3.24*  --   CALCIUM 7.7*  --    GFR: CrCl cannot be calculated (Unknown ideal weight.). Liver Function Tests: Recent Labs  Lab 05/14/18 1138  AST 33    ALT 26  ALKPHOS 116  BILITOT 0.5  PROT 6.8  ALBUMIN 2.8*   No results for input(s): LIPASE, AMYLASE in the last 168 hours. No results for input(s): AMMONIA in the last 168 hours. Coagulation Profile: No results for input(s): INR, PROTIME in the last 168 hours. Cardiac Enzymes: No results for input(s): CKTOTAL, CKMB, CKMBINDEX, TROPONINI in the last 168 hours. BNP (last 3 results) No results for input(s): PROBNP in the last 8760 hours. HbA1C: No results for input(s): HGBA1C in the last 72 hours. CBG: No results for input(s): GLUCAP in the last 168 hours. Lipid Profile: No results for input(s): CHOL, HDL, LDLCALC, TRIG, CHOLHDL, LDLDIRECT in the last 72 hours. Thyroid Function Tests: No results for input(s): TSH, T4TOTAL, FREET4, T3FREE, THYROIDAB in the last 72 hours. Anemia  Panel: No results for input(s): VITAMINB12, FOLATE, FERRITIN, TIBC, IRON, RETICCTPCT in the last 72 hours. Urine analysis: No results found for: COLORURINE, APPEARANCEUR, LABSPEC, PHURINE, GLUCOSEU, HGBUR, BILIRUBINUR, KETONESUR, PROTEINUR, UROBILINOGEN, NITRITE, LEUKOCYTESUR  Radiological Exams on Admission: Dg Chest Port 1 View  Result Date: 05/14/2018 CLINICAL DATA:  Hypoxia.  Renal failure EXAM: PORTABLE CHEST 1 VIEW COMPARISON:  March 05, 2018. FINDINGS: Central catheter tip is at the cavoatrial junction. No pneumothorax. There is a right pleural effusion with consolidation in portions of the right middle and lower lobes. The left lung is clear. Heart is mildly enlarged with pulmonary vascularity normal. No adenopathy. No bone lesions. IMPRESSION: Central catheter as described without pneumothorax. Right pleural effusion with consolidation in portions of the right middle and lower lobes. Left lung clear. Mild cardiac enlargement. Electronically Signed   By: Lowella Grip III M.D.   On: 05/14/2018 11:38    EKG: Independently reviewed.  Normal sinus rhythm.  Shows tachycardia.  QTC is  500.  Assessment/Plan Principal Problem:   Sepsis ([place]) Active Problems:   HTN (hypertension)   Seizure ([place])   ESRD (end stage renal disease) ([place])   Sepsis with no endorgan damage, suspected from right lower lobe pneumonia as well as bacteremia: Agree with admission given severity of symptoms. Antibiotics to treat both gram-positive and gram-negative bacteria because of patient being a hemodialysis patient and nursing home resident. Chest physiotherapy, incentive spirometry, deep breathing exercises, sputum induction, mucolytic's and bronchodilators. Sputum cultures, blood cultures, Supplemental oxygen to keep saturations more than 90%.  ESRD on hemodialysis: Patient finished hemodialysis today.  Next dialysis due on Friday.  His electrolytes are stable.  He is not with any fluid overload.  Will consult nephrology for hemodialysis if he stays until Friday.  Patient is adamant that he will go home tomorrow if he feels fine and will continue his outpatient dialysis, however I told him this may not be possible.  Hypertension: Stable.  His blood pressures are stable.  Will resume home medications.  Type 2 diabetes: On metformin.  Hold and keep on sliding scale insulin.  Seizure disorder: On phenytoin.  No recurrent seizures.  Continue.  DVT prophylaxis: Heparin subcu. Code Status: Full code. Family Communication: Patient with capacity to make medical decisions. Disposition Plan: Back to nursing home. Consults called: None. Admission status: Inpatient.   Barb Merino MD [facility] Pager [phone]  If 7PM-7AM, please contact night-coverage [email or web address] Password [email or web address]  05/14/2018, 1:52 PM

## 2018-05-14 NOTE — Progress Notes (Signed)
Patient has declined to have labs drawn including lactic acid. He has been educated on the importance and verbalized understanding.

## 2018-05-14 NOTE — Progress Notes (Signed)
Patient had refused heparin SQ for VTE prophylaxis and Phenytoin administration. Reports " do not take these medicine" and adamantly naming drugs to include Protonix and Imodium that he claimed to have. Provided the importance of the declined drugs but refusing the teaching. Serenity Batley Ladora Daniel, BSN, RN

## 2018-05-14 NOTE — ED Notes (Signed)
PT HAS REFUSED ANYMORE BLOOD WORK.

## 2018-05-14 NOTE — ED Provider Notes (Signed)
[facility] EMERGENCY DEPARTMENT Provider Note   CSN: [id number] Arrival date & time: 05/14/18  1105   LEVEL 5 CAVEAT - RESPIRATORY DISTRESS  History   Chief Complaint Chief Complaint  Patient presents with  . Shortness of Breath    HPI Eddie Watson is a 63 y.o. male.  HPI  63 year old male presents from dialysis with acute dyspnea.  He states that all of his symptoms started while at dialysis.  He did not get full dialysis but got about 3-1/2 hours.  He has been coughing and severely short of breath.  No fever was documented though he felt warm and was given Tylenol.  No pain including no chest pain.  Past Medical History:  Diagnosis Date  . Chronic diarrhea   . Diabetes mellitus without complication ([place])   . Seizures [facility])     Patient Active Problem List   Diagnosis Date Noted  . Chest pain 09/23/2013  . Diabetes ([place]) 09/23/2013  . Chronic diarrhea 09/23/2013  . HTN (hypertension) 09/23/2013  . Chest pain at rest 09/23/2013  . Anemia 09/23/2013    Past Surgical History:  Procedure Laterality Date  . CHOLECYSTECTOMY    . JOINT REPLACEMENT Right     x 2        Home Medications    Prior to Admission medications   Medication Sig Start Date End Date Taking? Authorizing Provider  lisinopril (PRINIVIL,ZESTRIL) 20 MG tablet Take 20 mg by mouth daily.    [provider]  loperamide (IMODIUM) 2 MG capsule Take 2 mg by mouth as needed for diarrhea or loose stools.    [provider]  magnesium oxide (MAG-OX) 400 MG tablet Take 400 mg by mouth daily.    [provider]  metFORMIN (GLUCOPHAGE) 500 MG tablet Take 500 mg by mouth 2 (two) times daily with a meal.    [provider]  Multiple Vitamin (MULTIVITAMIN WITH MINERALS) TABS tablet Take 1 tablet by mouth daily.    [provider]  phenytoin (DILANTIN) 100 MG ER capsule Take 100 mg by mouth 3 (three) times daily.    [provider]    triamcinolone (NASACORT ALLERGY 24HR) 55 MCG/ACT AERO nasal inhaler Place 2 sprays into the nose daily.    [provider]    Family History History reviewed. No pertinent family history.  Social History Social History   Tobacco Use  . Smoking status: Former Research scientist (life sciences)  . Smokeless tobacco: Never Used  Substance Use Topics  . Alcohol use: No  . Drug use: No     Allergies   Penicillins and Tomato   Review of Systems Review of Systems  Constitutional: Negative for fever.  Respiratory: Positive for cough and shortness of breath.      Physical Exam Updated Vital Signs BP (!) 142/76 (BP Location: Right Arm)   Pulse (!) 108   Temp (!) 103.4 F (39.7 C) (Rectal)   Resp (!) 35   SpO2 100%   Physical Exam Vitals signs and nursing note reviewed.  Constitutional:      Appearance: He is well-developed.  HENT:     Head: Normocephalic and atraumatic.     Right Ear: External ear normal.     Left Ear: External ear normal.     Nose: Nose normal.  Eyes:     General:        Right eye: No discharge.        Left eye: No discharge.  Neck:  Musculoskeletal: Neck supple.  Cardiovascular:     Rate and Rhythm: Regular rhythm. Tachycardia present.     Heart sounds: Normal heart sounds.  Pulmonary:     Effort: Tachypnea, accessory muscle usage and respiratory distress present.     Breath sounds: Wheezing and rhonchi present.  Abdominal:     Palpations: Abdomen is soft.     Tenderness: There is no abdominal tenderness.  Skin:    General: Skin is warm and dry.  Neurological:     Mental Status: He is alert.  Psychiatric:        Mood and Affect: Mood is not anxious.      ED Treatments / Results  Labs (all labs ordered are listed, but only abnormal results are displayed) Labs Reviewed  COMPREHENSIVE METABOLIC PANEL - Abnormal; Notable for the following components:      Result Value   Chloride 97 (*)    Creatinine, Ser 3.24 (*)    Calcium 7.7 (*)    Albumin  2.8 (*)    GFR calc non Af Amer 20 (*)    GFR calc Af Amer 23 (*)    All other components within normal limits  CBC WITH DIFFERENTIAL/PLATELET - Abnormal; Notable for the following components:   WBC 10.8 (*)    RBC 2.81 (*)    Hemoglobin 7.4 (*)    HCT 24.7 (*)    RDW 17.4 (*)    Neutro Abs 10.2 (*)    Lymphs Abs 0.3 (*)    All other components within normal limits  POCT I-STAT EG7 - Abnormal; Notable for the following components:   pH, Ven 7.477 (*)    Bicarbonate 32.2 (*)    TCO2 33 (*)    Acid-Base Excess 9.0 (*)    Calcium, Ion 1.00 (*)    HCT 25.0 (*)    Hemoglobin 8.5 (*)    All other components within normal limits  CULTURE, BLOOD (ROUTINE X 2)  CULTURE, BLOOD (ROUTINE X 2)  EXPECTORATED SPUTUM ASSESSMENT W REFEX TO RESP CULTURE  LACTIC ACID, PLASMA  INFLUENZA PANEL BY PCR (TYPE A & B)  LACTIC ACID, PLASMA  BLOOD GAS, ARTERIAL  HIV ANTIBODY (ROUTINE TESTING W REFLEX)    EKG EKG Interpretation  Date/Time:  Wednesday May 14 2018 11:11:45 EST Ventricular Rate:  109 PR Interval:    QRS Duration: 81 QT Interval:  371 QTC Calculation: 500 R Axis:   60 Text Interpretation:  Sinus tachycardia Nonspecific T abnrm, anterolateral leads Borderline prolonged QT interval Baseline wander in lead(s) V3 rate is faster compared to Dec 2016 Confirmed by [place] Gambler [phone]) on 05/14/2018 11:20:06 AM   Radiology Dg Chest Port 1 View  Result Date: 05/14/2018 CLINICAL DATA:  Hypoxia.  Renal failure EXAM: PORTABLE CHEST 1 VIEW COMPARISON:  March 05, 2018. FINDINGS: Central catheter tip is at the cavoatrial junction. No pneumothorax. There is a right pleural effusion with consolidation in portions of the right middle and lower lobes. The left lung is clear. Heart is mildly enlarged with pulmonary vascularity normal. No adenopathy. No bone lesions. IMPRESSION: Central catheter as described without pneumothorax. Right pleural effusion with consolidation in portions of the right  middle and lower lobes. Left lung clear. Mild cardiac enlargement. Electronically Signed   By: Lowella Grip III M.D.   On: 05/14/2018 11:38    Procedures .Critical Care Performed by: [place] Gambler, MD Authorized by: [place] Gambler, MD   Critical care provider statement:    Critical care time (minutes):  30   Critical care time was exclusive of:  Separately billable procedures and treating other patients   Critical care was necessary to treat or prevent imminent or life-threatening deterioration of the following conditions:  Respiratory failure and sepsis   Critical care was time spent personally by me on the following activities:  Development of treatment plan with patient or surrogate, discussions with consultants, evaluation of patient's response to treatment, examination of patient, obtaining history from patient or surrogate, ordering and performing treatments and interventions, ordering and review of laboratory studies, ordering and review of radiographic studies, pulse oximetry, re-evaluation of patient's condition and review of old charts   (including critical care time)  Medications Ordered in ED Medications  vancomycin (VANCOCIN) IVPB 1000 mg/200 mL premix (has no administration in time range)  aztreonam (AZACTAM) 2 g in sodium chloride 0.9 % 100 mL IVPB (has no administration in time range)  albuterol (PROVENTIL) (2.5 MG/3ML) 0.083% nebulizer solution 5 mg (has no administration in time range)     Initial Impression / Assessment and Plan / ED Course  I have reviewed the triage vital signs and the nursing notes.  Pertinent labs & imaging results that were available during my care of the patient were reviewed by me and considered in my medical decision making (see chart for details).     Given patient's significant increased work of breathing on arrival, he was placed on BiPAP and given albuterol.  Chest x-ray shows right-sided effusion and pneumonia.  His temperature  has slowly come down and he started to feel much better.  He was able to be taken off of BiPAP but still has hospital-acquired pneumonia and will need IV antibiotics and treatment.  Hospitalist will admit.  Final Clinical Impressions(s) / ED Diagnoses   Final diagnoses:  HCAP (healthcare-associated pneumonia)    ED Discharge Orders    None       [place] Gambler, MD 05/14/18 1517

## 2018-05-14 NOTE — ED Notes (Signed)
Pt off Bipap on 3 liters n/c  nad

## 2018-05-15 DIAGNOSIS — N186 End stage renal disease: Secondary | ICD-10-CM

## 2018-05-15 DIAGNOSIS — A419 Sepsis, unspecified organism: Secondary | ICD-10-CM

## 2018-05-15 DIAGNOSIS — E1122 Type 2 diabetes mellitus with diabetic chronic kidney disease: Secondary | ICD-10-CM

## 2018-05-15 DIAGNOSIS — R7881 Bacteremia: Secondary | ICD-10-CM | POA: Diagnosis present

## 2018-05-15 DIAGNOSIS — B961 Klebsiella pneumoniae [K. pneumoniae] as the cause of diseases classified elsewhere: Secondary | ICD-10-CM

## 2018-05-15 DIAGNOSIS — J189 Pneumonia, unspecified organism: Secondary | ICD-10-CM

## 2018-05-15 DIAGNOSIS — K529 Noninfective gastroenteritis and colitis, unspecified: Secondary | ICD-10-CM

## 2018-05-15 DIAGNOSIS — I10 Essential (primary) hypertension: Secondary | ICD-10-CM

## 2018-05-15 DIAGNOSIS — Z992 Dependence on renal dialysis: Secondary | ICD-10-CM

## 2018-05-15 LAB — GLUCOSE, CAPILLARY
GLUCOSE-CAPILLARY: 159 mg/dL — AB (ref 70–99)
Glucose-Capillary: 177 mg/dL — ABNORMAL HIGH (ref 70–99)
Glucose-Capillary: 213 mg/dL — ABNORMAL HIGH (ref 70–99)
Glucose-Capillary: 142 mg/dL — ABNORMAL HIGH (ref 70–99)

## 2018-05-15 LAB — BLOOD CULTURE ID PANEL (REFLEXED)
Acinetobacter baumannii: NOT DETECTED
Candida albicans: NOT DETECTED
Candida glabrata: NOT DETECTED
Candida krusei: NOT DETECTED
Candida parapsilosis: NOT DETECTED
Candida tropicalis: NOT DETECTED
Carbapenem resistance: NOT DETECTED
ENTEROBACTERIACEAE SPECIES: DETECTED — AB
Enterobacter cloacae complex: NOT DETECTED
Enterococcus species: NOT DETECTED
Escherichia coli: NOT DETECTED
Haemophilus influenzae: NOT DETECTED
Klebsiella oxytoca: NOT DETECTED
Klebsiella pneumoniae: DETECTED — AB
Listeria monocytogenes: NOT DETECTED
NEISSERIA MENINGITIDIS: NOT DETECTED
PSEUDOMONAS AERUGINOSA: NOT DETECTED
Proteus species: NOT DETECTED
STREPTOCOCCUS PYOGENES: NOT DETECTED
Serratia marcescens: NOT DETECTED
Staphylococcus aureus (BCID): NOT DETECTED
Staphylococcus species: NOT DETECTED
Streptococcus agalactiae: NOT DETECTED
Streptococcus pneumoniae: NOT DETECTED
Streptococcus species: NOT DETECTED

## 2018-05-15 MED ORDER — PANTOPRAZOLE SODIUM 40 MG PO TBEC
40.0000 mg | DELAYED_RELEASE_TABLET | Freq: Two times a day (BID) | ORAL | Status: DC
  Administered 2018-05-15 – 2018-05-17 (×5): 40 mg via ORAL
  Filled 2018-05-15 (×5): qty 1

## 2018-05-15 MED ORDER — HYDROCOD POLST-CPM POLST ER 10-8 MG/5ML PO SUER
5.0000 mL | Freq: Once | ORAL | Status: AC
  Administered 2018-05-15: 5 mL via ORAL

## 2018-05-15 MED ORDER — CALCITRIOL 0.25 MCG PO CAPS
0.2500 ug | ORAL_CAPSULE | Freq: Every day | ORAL | Status: DC
  Administered 2018-05-15 – 2018-05-16 (×2): 0.25 ug via ORAL
  Filled 2018-05-15 (×2): qty 1

## 2018-05-15 NOTE — Progress Notes (Addendum)
[facility] PROGRESS NOTE  Eddie Watson SVX:[id number] DOB: 1956/02/09 DOA: 05/14/2018 PCP: Patient, No Pcp Per  Assessment/Plan: 1. Sepsis secondary to Klebsiella pneumonia with bacteremia.  Chest x-ray shows right lower lobe findings concerning for pneumonia has responded well to antibiotics, has remained afebrile over the last 24 hours, will continue to monitor CBC to ensure resolution of sepsis physiology.  Vancomycin discontinued given Klebsiella bacteremia continue aztreonam while awaiting de-escalation based on blood culture findings.  2. Hypotension.  Most recent systolic blood pressures ranging 98-100.  Will hold home amlodipine, labetalol and hydralazine and continue to monitor.  Likely resulting of sepsis/infection.  Still perfusing well with no signs of lactic acidosis.  3. ESRD on HD, MWF.  Tolerated HD session on 2/5.  Will consult nephrology to add to list for 2/7 to continue home regimen.  Compensated on exam.  Continue calcitriol and multivitamin.  Holding torsemide  4. GERD, stable.  Continue home Protonix  5. Type 2 diabetes.  Reportedly on metformin at home holding here, monitor on sliding scale and CBG, check A1c.  6. Reported seizure disorder.  Patient denies taking any phenytoin and not on home medication reconciliation, will continue to monitor.  7. Anemia, chronic.  Last documented hemoglobin of 11 (04/28/2014) 7.4 on admission now 8.5.  We will continue to monitor.  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  No active signs or symptoms of bleeding.  8. Chronic diarrhea.  Continue home Imodium  Code Status: Full code Family Communication: None at bedside  Disposition Plan: Continue IV antibiotics while monitoring blood cultures to further narrow,   Consultants:  None  Procedures:  none  Antibiotics:  2/5 vancomycin  2/5--aztreonam  Microbiology  2/5 blood culture 1 of 2 Klebsiella pneumonia  HPI/Subjective:  Eddie Watson is a 63 y.o. 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  Initially patient was placed on BiPAP therapy due to respiratory distress.  He was found to have a T-max of 103 with leukocytosis consistent with sepsis and started on empiric vancomycin and aztreonam (anaphylaxis to penicillin).  His vancomycin was discontinued on 2/6 given positive blood cultures for Klebsiella pneumonia  This a.m. patient wanted to leave [facility] as he stated he does not stay in hospitals.  He understands risk benefits of leaving given his pneumonia infection and bacteremia.  However when it was explained to patient that his skilled nurse facility will not accept him back if he left AMA patient agreed to stay  Objective: Vitals:   05/14/18 2049 05/15/18 0300  BP: 98/66 100/68  Pulse: 74 72  Resp: 20 18  Temp: 98.7 F (37.1 C) 98 F (36.7 C)  SpO2: 95% 100%    Intake/Output Summary (Last 24 hours) at 05/15/2018 1408 Last data filed at 05/14/2018 1605 Gross per 24 hour  Intake 550 ml  Output -  Net 550 ml   Filed Weights   05/14/18 1554  Weight: 79.3 kg    Exam:   General: Lying in bed comfortably, no acute distress  Cardiovascular: Regular rate and rhythm, no appreciable murmurs rubs or gallops, no peripheral edema  Respiratory: Normal respiratory effort on room air, no crackles or wheezes  Abdomen: Soft abdomen, nondistended, normal bowel sounds  Musculoskeletal: Normal range of motion  Skin port in place in left chest, AV fistula in place in right upper arm with palpable thrill and audible bruit  Neurologic alert oriented x4, no appreciable focal deficits  Data Reviewed: Basic Metabolic Panel: Recent Labs  Lab  05/14/18 1138 05/14/18 1148  NA 137 135  K 3.7 3.5  CL 97*  --   CO2 27  --   GLUCOSE 96  --   BUN 13  --   CREATININE 3.24*  --   CALCIUM 7.7*  --    Liver Function Tests: Recent Labs  Lab 05/14/18 1138  AST 33  ALT 26  ALKPHOS 116  BILITOT 0.5  PROT 6.8  ALBUMIN 2.8*   No results for input(s): LIPASE, AMYLASE in the last 168 hours. No results for input(s): AMMONIA in the last 168 hours. CBC: Recent Labs  Lab 05/14/18 1138 05/14/18 1148  WBC 10.8*  --   NEUTROABS 10.2*  --   HGB 7.4* 8.5*  HCT 24.7* 25.0*  MCV 87.9  --   PLT 297  --    Cardiac Enzymes: No results for input(s): CKTOTAL, CKMB, CKMBINDEX, TROPONINI in the last 168 hours. BNP (last 3 results) No results for input(s): BNP in the last 8760 hours.  ProBNP (last 3 results) No results for input(s): PROBNP in the last 8760 hours.  CBG: Recent Labs  Lab 05/14/18 1725 05/14/18 2218 05/15/18 0636 05/15/18 1154  GLUCAP 146* 156* 159* 177*    Recent Results (from the past 240 hour(s))  Blood Culture (routine x 2)     Status: None (Preliminary result)   Collection Time: 05/14/18 11:30 AM  Result Value Ref Range Status   Specimen Description BLOOD LEFT HAND  Final   Special Requests   Final    BOTTLES DRAWN AEROBIC AND ANAEROBIC Blood Culture adequate volume   Culture   Final    NO GROWTH < 24 HOURS Performed at [facility], [place] [address] [place], [place] [address]    Report Status PENDING  Incomplete  Blood Culture (routine x 2)     Status: None (Preliminary result)   Collection Time: 05/14/18 11:50 AM  Result Value Ref Range Status   Specimen Description BLOOD LEFT HAND  Final   Special Requests   Final    BOTTLES DRAWN AEROBIC AND ANAEROBIC Blood Culture results may not be optimal due to an excessive volume of blood received in culture bottles   Culture  Setup Time   Final    GRAM NEGATIVE RODS AEROBIC BOTTLE ONLY Organism ID to follow CRITICAL RESULT CALLED TO, READ BACK BY  AND VERIFIED WITH: Kerry Kass [facility] [id number] MLM Performed at [facility], [address], [place], [place] [address]    Culture GRAM NEGATIVE RODS  Final   Report Status PENDING  Incomplete  Blood Culture ID Panel (Reflexed)     Status: Abnormal   Collection Time: 05/14/18 11:50 AM  Result Value Ref Range Status   Enterococcus species NOT DETECTED NOT DETECTED Final   Listeria monocytogenes NOT DETECTED NOT DETECTED Final   Staphylococcus species NOT DETECTED NOT DETECTED Final   Staphylococcus aureus (BCID) NOT DETECTED NOT DETECTED Final   Streptococcus species NOT DETECTED NOT DETECTED Final   Streptococcus agalactiae NOT DETECTED NOT DETECTED Final   Streptococcus pneumoniae NOT DETECTED NOT DETECTED Final   Streptococcus pyogenes NOT DETECTED NOT DETECTED Final   Acinetobacter baumannii NOT DETECTED NOT DETECTED Final   Enterobacteriaceae species DETECTED (A)  NOT DETECTED Final    Comment: Enterobacteriaceae represent a large family of gram-negative bacteria, not a single organism. CRITICAL RESULT CALLED TO, READ BACK BY AND VERIFIED WITH: PHARMD M PHAM [id number] M LM    Enterobacter cloacae complex NOT DETECTED NOT DETECTED Final   Escherichia coli NOT DETECTED NOT DETECTED Final   Klebsiella oxytoca NOT DETECTED NOT DETECTED Final   Klebsiella pneumoniae DETECTED (A) NOT DETECTED Final    Comment: CRITICAL RESULT CALLED TO, READ BACK BY AND VERIFIED WITH: PHARMD M PHAM [phone] MLM    Proteus species NOT DETECTED NOT DETECTED Final   Serratia marcescens NOT DETECTED NOT DETECTED Final   Carbapenem resistance NOT DETECTED NOT DETECTED Final   Haemophilus influenzae NOT DETECTED NOT DETECTED Final   Neisseria meningitidis NOT DETECTED NOT DETECTED Final   Pseudomonas aeruginosa NOT DETECTED NOT DETECTED Final   Candida albicans NOT DETECTED NOT DETECTED Final   Candida glabrata NOT DETECTED NOT DETECTED Final   Candida krusei NOT DETECTED NOT DETECTED  Final   Candida parapsilosis NOT DETECTED NOT DETECTED Final   Candida tropicalis NOT DETECTED NOT DETECTED Final    Comment: Performed at [facility], [place] [address], [place], [place] [address]     Studies: Dg Chest Port 1 View  Result Date: 05/14/2018 CLINICAL DATA:  Hypoxia.  Renal failure EXAM: PORTABLE CHEST 1 VIEW COMPARISON:  March 05, 2018. FINDINGS: Central catheter tip is at the cavoatrial junction. No pneumothorax. There is a right pleural effusion with consolidation in portions of the right middle and lower lobes. The left lung is clear. Heart is mildly enlarged with pulmonary vascularity normal. No adenopathy. No bone lesions. IMPRESSION: Central catheter as described without pneumothorax. Right pleural effusion with consolidation in portions of the right middle and lower lobes. Left lung clear. Mild cardiac enlargement. Electronically Signed   By: Lowella Grip III M.D.   On: 05/14/2018 11:38    Scheduled Meds: . heparin  5,000 Units Subcutaneous Q8H  . insulin aspart  0-5 Units Subcutaneous QHS  . insulin aspart  0-9 Units Subcutaneous TID WC  . multivitamin  1 tablet Oral QHS   Continuous Infusions: . aztreonam 0.5 g (05/15/18 0947)    Principal Problem:   Sepsis ([place]) Active Problems:   HTN (hypertension)   Seizure ([place])   ESRD (end stage renal disease) ([place])      Desiree Hane  [facility]

## 2018-05-15 NOTE — Progress Notes (Signed)
Patient refused blood draw. Educated on importance of having labs done, and patient stated that he is tired of being stuck. Provider notified.

## 2018-05-15 NOTE — Progress Notes (Signed)
PHARMACY - PHYSICIAN COMMUNICATION CRITICAL VALUE ALERT - BLOOD CULTURE IDENTIFICATION (BCID)  Eddie Watson is an 63 y.o. male who presented to [facility] on 05/14/2018 with a chief complaint of fever and SOB.   Assessment:  ESRD patient who presented from SNF with acute SOB from the HD center. Labs just called with the BCID result of kleb pneumo in 1/4 bottles so far. He is currently on vanc/aztreonam due to PCN allergy. No hx of ceph use in the computer. Based on protocol, we will cont aztreonam and dc vancomycin.  Name of physician (or Provider) Contacted: Dr. Lonny Prude  Current antibiotics: vanc/aztreonam  Changes to prescribed antibiotics recommended:  Cont aztreonam, Dc vanc  Onnie Boer, PharmD, [place], AAHIVP, CPP Infectious Disease Pharmacist 05/15/2018 8:37 AM

## 2018-05-15 NOTE — Consult Note (Signed)
[name] Renal Consultation Note  Indication for Consultation:  Management of ESRD/hemodialysis; anemia, hypertension/volume and secondary hyperparathyroidism  HPI: Eddie Watson is a 63 y.o. male with ESRD d/t ?HTN, started HD 03/2018 at [facility]( .[facility]) med problems =  - HTN, Type 2 DM (A1c 5.4%), Hep C, GERD, CHF, erosive esophagitis, Hx cholecystectomy, chronic diarrhea,and  history of seizure who has been at a nursing home since last 2 months.   Was sent from  [facility] 02/05/ by Dr Joelyn Oms HD = "sudden onset dyspnea with HD, inc WOB< some scattered wheezing. T 99.5 (took B Cx x2) BP stable, was rec UF, denies CP."  Admitted with RLL PNA  Temp 103.4  in ER  BC Pos =Klebsiella pneumoniae  Chest x-ray shows right lower lobe consolidation and effusion. Initially started on BiPAP for respiratory distress,now weaned to room air   We are consulted today  For ESRD /HD issues / plans for HD  tomorrow as seen in room no sob/cp. Denies any problems before 2/05 ie = no n/v/d/ dizziness , abd pain ,or dyspnea . SOB started end of hd tx         Past Medical History:  Diagnosis Date  . Chronic diarrhea   . Diabetes mellitus without complication ([place])   . Seizures ([place])     Past Surgical History:  Procedure Laterality Date  . CHOLECYSTECTOMY    . JOINT REPLACEMENT Right     x 2     History reviewed. No pertinent family history.    reports that he has quit smoking. He has never used smokeless tobacco. He reports that he does not drink alcohol or use drugs.   Allergies  Allergen Reactions  . Penicillins Anaphylaxis    Has patient had a PCN reaction causing immediate rash, facial/tongue/throat swelling, SOB or lightheadedness with hypotension: Yes Has patient had a PCN reaction causing severe rash involving mucus membranes or skin necrosis: Yes Has patient had a PCN reaction that required hospitalization Yes Has patient had a  PCN reaction occurring within the last 10 years:unknown If all of the above answers are "NO", then may proceed with Cephalosporin use.   . Tomato     Face swells up like a basketball    Prior to Admission medications   Medication Sig Start Date End Date Taking? Authorizing Provider  acetaminophen (TYLENOL) 325 MG tablet Take 650 mg by mouth every 6 (six) hours as needed for mild pain, moderate pain or headache.   Yes [provider]  Amino Acids-Protein Hydrolys (FEEDING SUPPLEMENT, PRO-STAT SUGAR FREE 64,) LIQD Take 30 mLs by mouth at bedtime. Wild cherry   Yes [provider]  amLODipine (NORVASC) 10 MG tablet Take 10 mg by mouth daily.   Yes [provider]  b complex-vitamin c-folic acid (NEPHRO-VITE) 0.8 MG TABS tablet Take 1 tablet by mouth at bedtime.   Yes [provider]  calcitRIOL (ROCALTROL) 0.25 MCG capsule Take 0.25 mcg by mouth at bedtime.   Yes [provider]  Calcium Carb-Cholecalciferol (OS-CAL PO) Take 500 mg by mouth 3 (three) times daily.   Yes [provider]  cloNIDine (CATAPRES) 0.1 MG tablet Take 0.1 mg by mouth as needed (Blood pressure over 180).   Yes [provider]  hydrALAZINE (APRESOLINE) 10 MG tablet Take 10 mg by mouth every 4 (four) hours as needed (BLOOD PRESSURE GREATER THAN 180).   Yes [provider]  labetalol (NORMODYNE) 200  MG tablet Take 200 mg by mouth 2 (two) times daily.   Yes [provider]  loperamide (IMODIUM) 2 MG capsule Take 2 mg by mouth as needed for diarrhea or loose stools.   Yes [provider]  montelukast (SINGULAIR) 10 MG tablet Take 10 mg by mouth at bedtime.   Yes [provider]  nitroGLYCERIN (NITROSTAT) 0.4 MG SL tablet Place 0.4 mg under the tongue every 5 (five) minutes as needed for chest pain.   Yes [provider]  pantoprazole (PROTONIX) 40 MG tablet Take 40 mg by mouth 2 (two) times daily.   Yes [provider]  torsemide (DEMADEX) 20 MG tablet Take 40 mg by mouth daily.   Yes [provider]    WGN:FAOZHYQMVHQIO **OR** acetaminophen, albuterol, loperamide, ondansetron **OR** ondansetron (ZOFRAN) IV  Results for orders placed or performed during the hospital encounter of 05/14/18 (from the past 48 hour(s))  Blood Culture (routine x 2)     Status: None (Preliminary result)   Collection Time: 05/14/18 11:30 AM  Result Value Ref Range   Specimen Description BLOOD LEFT HAND    Special Requests      BOTTLES DRAWN AEROBIC AND ANAEROBIC Blood Culture adequate volume   Culture      NO GROWTH < 24 HOURS Performed at [facility] [address] [place], [place] [address]    Report Status PENDING   Lactic acid, plasma     Status: None   Collection Time: 05/14/18 11:38 AM  Result Value Ref Range   Lactic Acid, Venous 1.7 0.5 - 1.9 mmol/L    Comment: Performed at [facility] [address], [place], [place] [address]  Comprehensive metabolic panel     Status: Abnormal   Collection Time: 05/14/18 11:38 AM  Result Value Ref Range   Sodium 137 135 - 145 mmol/L   Potassium 3.7 3.5 - 5.1 mmol/L   Chloride 97 (L) 98 - 111 mmol/L   CO2 27 22 - 32 mmol/L   Glucose, Bld 96 70 - 99 mg/dL   BUN 13 8 - 23 mg/dL   Creatinine, Ser 3.24 (H) 0.61 - 1.24 mg/dL   Calcium 7.7 (L) 8.9 - 10.3 mg/dL   Total Protein 6.8 6.5 - 8.1 g/dL   Albumin 2.8 (L) 3.5 - 5.0 g/dL   AST 33 15 - 41 U/L   ALT 26 0 - 44 U/L   Alkaline Phosphatase 116 38 - 126 U/L   Total Bilirubin 0.5 0.3 - 1.2 mg/dL   GFR calc non Af Amer 20 (L) >60 mL/min   GFR calc Af Amer 23 (L) >60 mL/min   Anion gap 13 5 - 15    Comment: Performed at [facility], [place] [address], [place], [place] [address]  CBC WITH DIFFERENTIAL     Status: Abnormal   Collection Time: 05/14/18 11:38 AM  Result Value Ref Range   WBC 10.8 (H) 4.0 - 10.5 K/uL   RBC 2.81 (L) 4.22 - 5.81 MIL/uL   Hemoglobin 7.4 (L) 13.0 - 17.0 g/dL   HCT 24.7  (L) 39.0 - 52.0 %   MCV 87.9 80.0 - 100.0 fL   MCH 26.3 26.0 - 34.0 pg   MCHC 30.0 30.0 - 36.0 g/dL   RDW 17.4 (H) 11.5 - 15.5 %   Platelets 297 150 - 400 K/uL   nRBC 0.0 0.0 - 0.2 %   Neutrophils Relative % 93 %   Neutro  Abs 10.2 (H) 1.7 - 7.7 K/uL   Lymphocytes Relative 3 %   Lymphs Abs 0.3 (L) 0.7 - 4.0 K/uL   Monocytes Relative 1 %   Monocytes Absolute 0.1 0.1 - 1.0 K/uL   Eosinophils Relative 2 %   Eosinophils Absolute 0.2 0.0 - 0.5 K/uL   Basophils Relative 0 %   Basophils Absolute 0.0 0.0 - 0.1 K/uL   Immature Granulocytes 1 %   Abs Immature Granulocytes 0.06 0.00 - 0.07 K/uL   Tear Drop Cells PRESENT     Comment: Performed at [facility] [address] [place], [place] [address]  POCT I-Stat EG7     Status: Abnormal   Collection Time: 05/14/18 11:48 AM  Result Value Ref Range   pH, Ven 7.477 (H) 7.250 - 7.430   pCO2, Ven 44.6 44.0 - 60.0 mmHg   pO2, Ven 43.0 32.0 - 45.0 mmHg   Bicarbonate 32.2 (H) 20.0 - 28.0 mmol/L   TCO2 33 (H) 22 - 32 mmol/L   O2 Saturation 75.0 %   Acid-Base Excess 9.0 (H) 0.0 - 2.0 mmol/L   Sodium 135 135 - 145 mmol/L   Potassium 3.5 3.5 - 5.1 mmol/L   Calcium, Ion 1.00 (L) 1.15 - 1.40 mmol/L   HCT 25.0 (L) 39.0 - 52.0 %   Hemoglobin 8.5 (L) 13.0 - 17.0 g/dL   Patient temperature 103.4 F    Collection site RADIAL, ALLEN'S TEST ACCEPTABLE    Drawn by RT    Sample type VENOUS    Comment NOTIFIED PHYSICIAN   Blood Culture (routine x 2)     Status: None (Preliminary result)   Collection Time: 05/14/18 11:50 AM  Result Value Ref Range   Specimen Description BLOOD LEFT HAND    Special Requests      BOTTLES DRAWN AEROBIC AND ANAEROBIC Blood Culture results may not be optimal due to an excessive volume of blood received in culture bottles   Culture  Setup Time      GRAM NEGATIVE RODS AEROBIC BOTTLE ONLY Organism ID to follow CRITICAL RESULT CALLED TO, READ BACK BY AND VERIFIED WITH: Kerry Kass [facility] [id number] MLM Performed at  [facility], [address] [place], [place] [address]    Culture GRAM NEGATIVE RODS    Report Status PENDING   Blood Culture ID Panel (Reflexed)     Status: Abnormal   Collection Time: 05/14/18 11:50 AM  Result Value Ref Range   Enterococcus species NOT DETECTED NOT DETECTED   Listeria monocytogenes NOT DETECTED NOT DETECTED   Staphylococcus species NOT DETECTED NOT DETECTED   Staphylococcus aureus (BCID) NOT DETECTED NOT DETECTED   Streptococcus species NOT DETECTED NOT DETECTED   Streptococcus agalactiae NOT DETECTED NOT DETECTED   Streptococcus pneumoniae NOT DETECTED NOT DETECTED   Streptococcus pyogenes NOT DETECTED NOT DETECTED   Acinetobacter baumannii NOT DETECTED NOT DETECTED   Enterobacteriaceae species DETECTED (A) NOT DETECTED    Comment: Enterobacteriaceae represent a large family of gram-negative bacteria, not a single organism. CRITICAL RESULT CALLED TO, READ BACK BY AND VERIFIED WITH: PHARMD M PHAM [id number] 16 M LM    Enterobacter cloacae complex NOT DETECTED NOT DETECTED   Escherichia coli NOT DETECTED NOT DETECTED   Klebsiella oxytoca NOT DETECTED NOT DETECTED   Klebsiella pneumoniae DETECTED (A) NOT DETECTED    Comment: CRITICAL RESULT CALLED TO, READ BACK BY AND VERIFIED WITH: PHARMD M PHAM [id number] MLM    Proteus species NOT  DETECTED NOT DETECTED   Serratia marcescens NOT DETECTED NOT DETECTED   Carbapenem resistance NOT DETECTED NOT DETECTED   Haemophilus influenzae NOT DETECTED NOT DETECTED   Neisseria meningitidis NOT DETECTED NOT DETECTED   Pseudomonas aeruginosa NOT DETECTED NOT DETECTED   Candida albicans NOT DETECTED NOT DETECTED   Candida glabrata NOT DETECTED NOT DETECTED   Candida krusei NOT DETECTED NOT DETECTED   Candida parapsilosis NOT DETECTED NOT DETECTED   Candida tropicalis NOT DETECTED NOT DETECTED    Comment: Performed at [facility], [place] [address], [place], [place] [address]  Influenza panel by PCR (type A & B)      Status: None   Collection Time: 05/14/18  2:57 PM  Result Value Ref Range   Influenza A By PCR NEGATIVE NEGATIVE   Influenza B By PCR NEGATIVE NEGATIVE    Comment: (NOTE) The Xpert Xpress Flu assay is intended as an aid in the diagnosis of  influenza and should not be used as a sole basis for treatment.  This  assay is FDA approved for nasopharyngeal swab specimens only. Nasal  washings and aspirates are unacceptable for Xpert Xpress Flu testing. Performed at [facility], [place] [address], [place], [place] [address]   Glucose, capillary     Status: Abnormal   Collection Time: 05/14/18  5:25 PM  Result Value Ref Range   Glucose-Capillary 146 (H) 70 - 99 mg/dL  Glucose, capillary     Status: Abnormal   Collection Time: 05/14/18 10:18 PM  Result Value Ref Range   Glucose-Capillary 156 (H) 70 - 99 mg/dL  Glucose, capillary     Status: Abnormal   Collection Time: 05/15/18  6:36 AM  Result Value Ref Range   Glucose-Capillary 159 (H) 70 - 99 mg/dL  Glucose, capillary     Status: Abnormal   Collection Time: 05/15/18 11:54 AM  Result Value Ref Range   Glucose-Capillary 177 (H) 70 - 99 mg/dL  .  ROS: see hpi  Physical Exam: Vitals:   05/15/18 0300 05/15/18 1437  BP: 100/68 (!) 141/71  Pulse: 72 85  Resp: 18 18  Temp: 98 F (36.7 C) 98.6 F (37 C)  SpO2: 100% 95%     General: alert nad,AAM , WD, WN, Appropriate  HEENT: [place]  [place], not icteric , MMM Neck: no jvd Heart: RRR, No m, r, g  Lungs: CTA , unlabored breathing  Abdomen: BS pos , soft, NT, ND Extremities: No pedal  edema Skin:  No overt rash or pedal ulcers  Neuro: alert OX3, moves all extrem,  To requests , no overt acute focal deficits  Dialysis Access:  [facility] Perm cath nt, no erythema noted  /RUA AVF pos bruit   Dialysis Orders: [facility] MWF . EDW 77 kg HD Bath 2k, 2.5 ca  Time 4hr Heparin none (ok in pc flush). Access L IJ Perm cath  ( RUA AVF 108/20 Insert)  Calcitriol 0.5 mcg poHD  Mircera 150 mcg  q2wks ( last given 02/05)  Last  Op center hgb 6.8 (2/05)hgb  7.5  (1/29)hgb 6.9 ( 05/05/18)  Venofer  100 mg  q hd load (start 02/05 last dose 06/04/18)   Assessment/Plan 1. Sepsis with Klebsiella Pneumonia Bacteria and RLL PNA - per admit team on aztreonam  Now and stable / HD Perm cath . Site  non tender. HD tomor ( if temp. spike on hd need hd perm cath dc and perm cath holiday)  2.  ESRD -  HD MWF on schedule last k 3.5 use 4.0 k bath at hd start pending labs pre hd / dc Demadex on home med lisit 3. Hypertension/volume  - bp today range sbp 100-141 , admit holding hold home amlodipine, labetalol and hydralazine  (on admit lower 98-100)  , no excess vol on exam  4. Anemia  - ESA just given (02 /05) 7.4 hgb  Needs increase at dc (hold iron load with Sepsis ) 5. Metabolic bone disease -  Po vit d on hd / oscal as binder fu ca /phos labs 6. Reported seizure disorder. Per admit notes =" Patient denies taking any phenytoin and not on home medication reconciliation, will  Monitor." 7. DM type 2 - per admit  8. Chronic diarrhea. - per admit  Continue home [place], PA-C [facility] Bee[facility] [phone]. No excess vol on excam  05/15/2018, 3:28 PM

## 2018-05-15 NOTE — Progress Notes (Addendum)
Patient wants to leave AMA, Per Social work, [facility] systems [facility] rehab will not accept patient back if he leaves AMA. Patient was notified and he stated that he will stay but was not happy with [facility] decision not to take him back.  MD notified that patient will remain at the hospital. Will cont to monitor.

## 2018-05-16 LAB — CBC
HCT: 24.8 % — ABNORMAL LOW (ref 39.0–52.0)
Hemoglobin: 7.2 g/dL — ABNORMAL LOW (ref 13.0–17.0)
MCH: 25.1 pg — ABNORMAL LOW (ref 26.0–34.0)
MCHC: 29 g/dL — AB (ref 30.0–36.0)
MCV: 86.4 fL (ref 80.0–100.0)
Platelets: 319 10*3/uL (ref 150–400)
RBC: 2.87 MIL/uL — ABNORMAL LOW (ref 4.22–5.81)
RDW: 17.3 % — ABNORMAL HIGH (ref 11.5–15.5)
WBC: 8.5 10*3/uL (ref 4.0–10.5)
nRBC: 0 % (ref 0.0–0.2)

## 2018-05-16 LAB — BASIC METABOLIC PANEL
ANION GAP: 13 (ref 5–15)
BUN: 33 mg/dL — AB (ref 8–23)
CO2: 26 mmol/L (ref 22–32)
Calcium: 8.2 mg/dL — ABNORMAL LOW (ref 8.9–10.3)
Chloride: 98 mmol/L (ref 98–111)
Creatinine, Ser: 5.34 mg/dL — ABNORMAL HIGH (ref 0.61–1.24)
GFR calc Af Amer: 12 mL/min — ABNORMAL LOW (ref >60)
GFR calc non Af Amer: 11 mL/min — ABNORMAL LOW (ref >60)
GLUCOSE: 182 mg/dL — AB (ref 70–99)
POTASSIUM: 4.2 mmol/L (ref 3.5–5.1)
Sodium: 137 mmol/L (ref 135–145)

## 2018-05-16 LAB — HEMOGLOBIN A1C
Hgb A1c MFr Bld: 7 % — ABNORMAL HIGH (ref 4.8–5.6)
Mean Plasma Glucose: 154.2 mg/dL

## 2018-05-16 LAB — GLUCOSE, CAPILLARY
Glucose-Capillary: 191 mg/dL — ABNORMAL HIGH (ref 70–99)
Glucose-Capillary: 139 mg/dL — ABNORMAL HIGH (ref 70–99)
Glucose-Capillary: 112 mg/dL — ABNORMAL HIGH (ref 70–99)
Glucose-Capillary: 106 mg/dL — ABNORMAL HIGH (ref 70–99)
Glucose-Capillary: 156 mg/dL — ABNORMAL HIGH (ref 70–99)

## 2018-05-16 MED ORDER — METHYLPREDNISOLONE SODIUM SUCC 125 MG IJ SOLR: 60.0000 mg | INTRAMUSCULAR | Status: DC | PRN

## 2018-05-16 MED ORDER — SODIUM CHLORIDE 0.9 % IV SOLN
2.0000 g | INTRAVENOUS | Status: DC
  Administered 2018-05-16: 2 g via INTRAVENOUS
  Filled 2018-05-16: qty 2

## 2018-05-16 MED ORDER — HEPARIN SODIUM (PORCINE) 1000 UNIT/ML IJ SOLN
INTRAMUSCULAR | Status: AC
  Administered 2018-05-16: 3500 [IU]
  Filled 2018-05-16: qty 4

## 2018-05-16 MED ORDER — DIPHENHYDRAMINE HCL 50 MG/ML IJ SOLN: 25.0000 mg | INTRAMUSCULAR | Status: DC | PRN

## 2018-05-16 MED ORDER — AMLODIPINE BESYLATE 10 MG PO TABS
10.0000 mg | ORAL_TABLET | Freq: Every day | ORAL | Status: DC
  Administered 2018-05-16 – 2018-05-17 (×2): 10 mg via ORAL
  Filled 2018-05-16 (×2): qty 1

## 2018-05-16 MED ORDER — LABETALOL HCL 200 MG PO TABS
200.0000 mg | ORAL_TABLET | Freq: Two times a day (BID) | ORAL | Status: DC
  Administered 2018-05-16 – 2018-05-17 (×2): 200 mg via ORAL
  Filled 2018-05-16 (×2): qty 1

## 2018-05-16 NOTE — Progress Notes (Addendum)
Pharmacy Antibiotic Note  Eddie Watson is a 63 y.o. male admitted on 05/14/2018 with pneumonia and sepsis. His abx have been tailored to aztreonam because of his PCN allergy for his klebsiella bacteremia. We can't find any records for cephalosporin use in Epic. Since he is ESRD, therefore, any central lines are avoid it. Since he is here inpatient, we can try the 4th gen cephalosporins because it can be dosed with HD and the cross reactivity is very low. D/w the plan with Dr. Lonny Watson, we will attempt here. We will put in PRN benadryl and solumedrol just in case for reaction. Dr Eddie Watson agreed to plan.  Addendum  Spoke to Eddie Watson today about his allergy. He said that he remembered that it was a penicillin product and the reaction was swelling only. No angioedema, shortness of breath, or anaphylaxis.   Plan:  Dc aztreonam Cefepime 2g IV x1 then f/u with further HD schedule Benadryl 25mg  IV PRN x 3 for reaction Solumedrol 60mg  IV PRN x2 for reaction  Height: 6' (182.9 cm) Weight: 174 lb 13.2 oz (79.3 kg) IBW/kg (Calculated) : 77.6  Temp (24hrs), Avg:98.3 F (36.8 C), Min:98.1 F (36.7 C), Max:98.6 F (37 C)  Recent Labs  Lab 05/14/18 1138 05/16/18 0233  WBC 10.8* 8.5  CREATININE 3.24* 5.34*  LATICACIDVEN 1.7  --     Estimated Creatinine Clearance: 15.9 mL/min (A) (by C-G formula based on SCr of 5.34 mg/dL (H)).    Allergies  Allergen Reactions  . Penicillins Anaphylaxis    Has patient had a PCN reaction causing immediate rash, facial/tongue/throat swelling, SOB or lightheadedness with hypotension: Yes Has patient had a PCN reaction causing severe rash involving mucus membranes or skin necrosis: Yes Has patient had a PCN reaction that required hospitalization Yes Has patient had a PCN reaction occurring within the last 10 years:unknown If all of the above answers are "NO", then may proceed with Cephalosporin use.   . Tomato     Face swells up like a basketball     Antimicrobials this admission: 2/5 vancomycin >> 2/5 2/5 aztreonam >> 2/7 2/7 cefepime>>  Dose adjustments this admission:   Microbiology results: 2/5 blood>>kleb pneu sens pending  Eddie Watson, PharmD, BCIDP, AAHIVP, CPP Infectious Disease Pharmacist 05/16/2018 2:49 PM

## 2018-05-16 NOTE — Plan of Care (Signed)
  Problem: Education: Goal: Knowledge of General Education information will improve Description: Including pain rating scale, medication(s)/side effects and non-pharmacologic comfort measures Outcome: Progressing   Problem: Clinical Measurements: Goal: Respiratory complications will improve Outcome: Progressing   Problem: Activity: Goal: Risk for activity intolerance will decrease Outcome: Progressing   Problem: Pain Managment: Goal: General experience of comfort will improve Outcome: Progressing   Problem: Safety: Goal: Ability to remain free from injury will improve Outcome: Progressing   

## 2018-05-16 NOTE — Progress Notes (Signed)
[facility] PROGRESS NOTE  [name] EUM:[id number] DOB: 1956-01-17 DOA: 05/14/2018 PCP: Patient, No Pcp Per  Assessment/Plan: 1. Sepsis secondary to Klebsiella pneumonia with bacteremia.  Chest x-ray shows right lower lobe findings concerning for pneumonia has responded well to antibiotics, has remained afebrile over the last 24 hours,sepsis physiology resolved.  Switch from Aztreonam to Ceftriaxone. Will closely monitor given listed anaphylactic allergy to penicillin, PRN solumedrol. awaiting de-escalation based on blood culture findings.  2. Hypotension, resolved.  On admission hist systolic blood pressures ranging 98-100.  Now SBP ranging 155-160s. Will hold home amlodipine, labetalol and hydralazine and continue to monitor.  Likely resulting of sepsis/infection.  Still perfusing well with no signs of lactic acidosis. 3. Hypertension, not at goal due to holding home medications due to above. Will resume home BP meds (amlodopine and labetalol)and monitor  4. ESRD on HD, MWF.  Tolerated HD session on 2/5. Tolerated session in house on 2/7 Remains  compensated on exam.  Continue calcitriol and multivitamin.  Holding torsemide, nephrology following  5. GERD, stable.  Continue home Protonix  6. Type 2 diabetes, well controlled (A1c 7) Reportedly on metformin at home holding here, monitor on sliding scale and CBG,.  7. Reported seizure disorder.  Patient denies taking any phenytoin and not on home medication reconciliation, will continue to monitor.  8. Anemia, chronic.  Last documented hemoglobin of 11 (04/28/2014) 7.4 on admission now 8.5 and back to 7.2.  We will continue to monitor.  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  No active signs or symptoms of bleeding.  9. Chronic diarrhea.  Continue home Imodium  Code Status: Full code Family Communication: None at bedside  Disposition Plan: Continue IV antibiotics while monitoring blood cultures to further  narrow,   Consultants:  None  Procedures:  none  Antibiotics:  2/5 vancomycin  2/5--2/7aztreonam  2/7- ceftriaxone  Microbiology  2/5 blood culture 1 of 2 Klebsiella pneumonia  HPI/Subjective:  Eddie Watson is a 63 y.o. 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  Initially patient was placed on BiPAP therapy due to respiratory distress.  He was found to have a T-max of 103 with leukocytosis consistent with sepsis and started on empiric vancomycin and aztreonam (anaphylaxis to penicillin).  His vancomycin was discontinued on 2/6 given positive blood cultures for Klebsiella pneumonia  This a.m. irritated because he wants to go home. Non productive cough, no fevers or chills  Objective: Vitals:   05/16/18 1223 05/16/18 1549  BP: (!) 153/72 (!) 154/73  Pulse: 92 (!) 112  Resp: 16 16  Temp: 98.6 F (37 C) (!) 102 F (38.9 C)  SpO2: 92% (!) 86%    Intake/Output Summary (Last 24 hours) at 05/16/2018 1557 Last data filed at 05/16/2018 1118 Gross per 24 hour  Intake -  Output 2500 ml  Net -2500 ml   Filed Weights   05/14/18 1554 05/16/18 0705 05/16/18 1118  Weight: 79.3 kg 81.8 kg 79.3 kg    Exam:   General: Lying in bed comfortably, no acute distress  Cardiovascular: Regular rate and rhythm, no appreciable murmurs rubs or gallops, no peripheral edema  Respiratory: Normal respiratory effort on room air, no crackles or wheezes  Abdomen: Soft abdomen, nondistended, normal bowel sounds  Musculoskeletal: Normal range  of motion  Skin port in place in left chest, AV fistula in place in right upper arm with palpable thrill and audible bruit  Neurologic alert oriented x4, no  appreciable focal deficits  Data Reviewed: Basic Metabolic Panel: Recent Labs  Lab 05/14/18 1138 05/14/18 1148 05/16/18 0233  NA 137 135 137  K 3.7 3.5 4.2  CL 97*  --  98  CO2 27  --  26  GLUCOSE 96  --  182*  BUN 13  --  33*  CREATININE 3.24*  --  5.34*  CALCIUM 7.7*  --  8.2*   Liver Function Tests: Recent Labs  Lab 05/14/18 1138  AST 33  ALT 26  ALKPHOS 116  BILITOT 0.5  PROT 6.8  ALBUMIN 2.8*   No results for input(s): LIPASE, AMYLASE in the last 168 hours. No results for input(s): AMMONIA in the last 168 hours. CBC: Recent Labs  Lab 05/14/18 1138 05/14/18 1148 05/16/18 0233  WBC 10.8*  --  8.5  NEUTROABS 10.2*  --   --   HGB 7.4* 8.5* 7.2*  HCT 24.7* 25.0* 24.8*  MCV 87.9  --  86.4  PLT 297  --  319   Cardiac Enzymes: No results for input(s): CKTOTAL, CKMB, CKMBINDEX, TROPONINI in the last 168 hours. BNP (last 3 results) No results for input(s): BNP in the last 8760 hours.  ProBNP (last 3 results) No results for input(s): PROBNP in the last 8760 hours.  CBG: Recent Labs  Lab 05/15/18 1627 05/15/18 2153 05/16/18 0617 05/16/18 0817 05/16/18 1229  GLUCAP 213* 142* 191* 139* 112*    Recent Results (from the past 240 hour(s))  Blood Culture (routine x 2)     Status: None (Preliminary result)   Collection Time: 05/14/18 11:30 AM  Result Value Ref Range Status   Specimen Description BLOOD LEFT HAND  Final   Special Requests   Final    BOTTLES DRAWN AEROBIC AND ANAEROBIC Blood Culture adequate volume   Culture  Setup Time   Final    ANAEROBIC BOTTLE ONLY GRAM NEGATIVE RODS CRITICAL VALUE NOTED.  VALUE IS CONSISTENT WITH PREVIOUSLY REPORTED AND CALLED VALUE. Performed at [facility], [place] [address], [place], [place] [address]    Culture GRAM NEGATIVE RODS  Final   Report Status PENDING  Incomplete  Blood Culture (routine x 2)     Status: Abnormal (Preliminary result)   Collection Time: 05/14/18 11:50 AM  Result Value Ref Range Status    Specimen Description BLOOD LEFT HAND  Final   Special Requests   Final    BOTTLES DRAWN AEROBIC AND ANAEROBIC Blood Culture results may not be optimal due to an excessive volume of blood received in culture bottles   Culture  Setup Time   Final    GRAM NEGATIVE RODS IN BOTH AEROBIC AND ANAEROBIC BOTTLES Organism ID to follow CRITICAL RESULT CALLED TO, READ BACK BY AND VERIFIED WITH: PHARMD M PHAM M PHAM [phone] MLM    Culture (A)  Final    KLEBSIELLA PNEUMONIAE SUSCEPTIBILITIES TO FOLLOW Performed at [facility], [place] [address], [place], [place] [address]    Report Status PENDING  Incomplete  Blood Culture ID Panel (Reflexed)     Status: Abnormal   Collection Time: 05/14/18 11:50 AM  Result Value Ref Range Status   Enterococcus species NOT DETECTED NOT DETECTED Final   Listeria monocytogenes NOT DETECTED NOT DETECTED Final   Staphylococcus species NOT DETECTED NOT DETECTED Final  Staphylococcus aureus (BCID) NOT DETECTED NOT DETECTED Final   Streptococcus species NOT DETECTED NOT DETECTED Final   Streptococcus agalactiae NOT DETECTED NOT DETECTED Final   Streptococcus pneumoniae NOT DETECTED NOT DETECTED Final   Streptococcus pyogenes NOT DETECTED NOT DETECTED Final   Acinetobacter baumannii NOT DETECTED NOT DETECTED Final   Enterobacteriaceae species DETECTED (A) NOT DETECTED Final    Comment: Enterobacteriaceae represent a large family of gram-negative bacteria, not a single organism. CRITICAL RESULT CALLED TO, READ BACK BY AND VERIFIED WITH: PHARMD M PHAM [phone] M LM    Enterobacter cloacae complex NOT DETECTED NOT DETECTED Final   Escherichia coli NOT DETECTED NOT DETECTED Final   Klebsiella oxytoca NOT DETECTED NOT DETECTED Final   Klebsiella pneumoniae DETECTED (A) NOT DETECTED Final    Comment: CRITICAL RESULT CALLED TO, READ BACK BY AND VERIFIED WITH: PHARMD M PHAM [phone] MLM    Proteus species NOT DETECTED NOT DETECTED Final   Serratia  marcescens NOT DETECTED NOT DETECTED Final   Carbapenem resistance NOT DETECTED NOT DETECTED Final   Haemophilus influenzae NOT DETECTED NOT DETECTED Final   Neisseria meningitidis NOT DETECTED NOT DETECTED Final   Pseudomonas aeruginosa NOT DETECTED NOT DETECTED Final   Candida albicans NOT DETECTED NOT DETECTED Final   Candida glabrata NOT DETECTED NOT DETECTED Final   Candida krusei NOT DETECTED NOT DETECTED Final   Candida parapsilosis NOT DETECTED NOT DETECTED Final   Candida tropicalis NOT DETECTED NOT DETECTED Final    Comment: Performed at [facility], [place] [address], [place], [place] [address]     Studies: No results found.  Scheduled Meds: . calcitRIOL  0.25 mcg Oral QHS  . heparin  5,000 Units Subcutaneous Q8H  . insulin aspart  0-5 Units Subcutaneous QHS  . insulin aspart  0-9 Units Subcutaneous TID WC  . multivitamin  1 tablet Oral QHS  . pantoprazole  40 mg Oral BID   Continuous Infusions: . ceFEPime (MAXIPIME) IV      Principal Problem:   Sepsis ([place]) Active Problems:   Diabetes ([place])   Chronic diarrhea   HTN (hypertension)   Seizure ([place])   ESRD (end stage renal disease) ([place])   Bacteremia due to Klebsiella pneumoniae      Desiree Hane  [facility]

## 2018-05-16 NOTE — Progress Notes (Signed)
Patient temp 102.3, patient states I feel good, I just have a lot of clothing on me. No chills noted. Med with prn tylenol. MD notified via text page.Will cont to monitor.

## 2018-05-16 NOTE — NC FL2 (Signed)
[place] MEDICAID FL2 LEVEL OF CARE SCREENING TOOL     IDENTIFICATION  Patient Name: [place]: 03-29-1956 Sex: male Admission Date (Current Location): 05/14/2018  [facility] and [place] Number:  Herbalist and Address:  [facility] [place], [place] [address] [place], [place] [address]      Provider Number: [id number]  Attending Physician Name and Address:  Desiree Hane, MD  Relative Name and Phone Number:       Current Level of Care: Hospital Recommended Level of Care: [place] Prior Approval Number:    Date Approved/Denied:   PASRR Number: [id number]  Discharge Plan: SNF    Current Diagnoses: Patient Active Problem List   Diagnosis Date Noted  . Bacteremia due to Klebsiella pneumoniae 05/15/2018  . Sepsis ([place]) 05/14/2018  . Seizure ([place]) 05/14/2018  . ESRD (end stage renal disease) ([place]) 05/14/2018  . Chest pain 09/23/2013  . Diabetes ([place]) 09/23/2013  . Chronic diarrhea 09/23/2013  . HTN (hypertension) 09/23/2013  . Chest pain at rest 09/23/2013  . Anemia 09/23/2013    Orientation RESPIRATION BLADDER Height & Weight     Self, Time, Situation, Place  Normal Continent Weight: 81.8 kg Height:  6' (182.9 cm)  BEHAVIORAL SYMPTOMS/MOOD NEUROLOGICAL BOWEL NUTRITION STATUS      Continent Diet(see discharge summary)  AMBULATORY STATUS COMMUNICATION OF NEEDS Skin   Extensive Assist Verbally Normal                       Personal Care Assistance Level of Assistance  Bathing, Feeding, Dressing, Total care Bathing Assistance: Maximum assistance Feeding assistance: Independent Dressing Assistance: Maximum assistance Total Care Assistance: Maximum assistance   Functional Limitations Info  Sight, Hearing, Speech Sight Info: Adequate Hearing Info: Adequate Speech Info: Adequate    SPECIAL CARE FACTORS FREQUENCY  OT (By licensed OT), PT (By licensed PT)     PT Frequency: min 5x weekly OT Frequency:  min 5x weekly            Contractures Contractures Info: Not present    Additional Factors Info  Code Status, Isolation Precautions, Allergies Code Status Info: Full Allergies Info: Penicillins, tomato     Isolation Precautions Info: droplet precautions     Current Medications (05/16/2018):  This is the current hospital active medication list Current Facility-Administered Medications  Medication Dose Route Frequency Provider Last Rate Last Dose  . acetaminophen (TYLENOL) tablet 650 mg  650 mg Oral Q6H PRN Barb Merino, MD       Or  . acetaminophen (TYLENOL) suppository 650 mg  650 mg Rectal Q6H PRN Barb Merino, MD      . albuterol (PROVENTIL) (2.5 MG/3ML) 0.083% nebulizer solution 2.5 mg  2.5 mg Nebulization Q4H PRN Barb Merino, MD      . aztreonam (AZACTAM) 0.5 g in dextrose 5 % 50 mL IVPB  0.5 g Intravenous Q12H Barb Merino, MD 100 mL/hr at 05/16/18 1152 0.5 g at 05/16/18 1152  . calcitRIOL (ROCALTROL) capsule 0.25 mcg  0.25 mcg Oral QHS Oretha Milch D, MD   0.25 mcg at 05/15/18 2230  . heparin injection 5,000 Units  5,000 Units Subcutaneous Q8H Ghimire, Dante Gang, MD      . insulin aspart (novoLOG) injection 0-5 Units  0-5 Units Subcutaneous QHS Barb Merino, MD      . insulin aspart (novoLOG) injection 0-9 Units  0-9 Units Subcutaneous TID WC Barb Merino, MD   3 Units at 05/15/18 1631  .  loperamide (IMODIUM) capsule 2 mg  2 mg Oral PRN Barb Merino, MD   2 mg at 05/16/18 1153  . multivitamin (RENA-VIT) tablet 1 tablet  1 tablet Oral QHS Skeet Simmer, [facility]   1 tablet at 05/15/18 2230  . ondansetron (ZOFRAN) tablet 4 mg  4 mg Oral Q6H PRN Barb Merino, MD       Or  . ondansetron (ZOFRAN) injection 4 mg  4 mg Intravenous Q6H PRN Barb Merino, MD      . pantoprazole (PROTONIX) EC tablet 40 mg  40 mg Oral BID Oretha Milch D, MD   40 mg at 05/16/18 1152     Discharge Medications: Please see discharge summary for a list of discharge medications.  Relevant  Imaging Results:  Relevant Lab Results:   Additional Information SSN: [id number]  Alberteen Sam, LCSW

## 2018-05-16 NOTE — Progress Notes (Signed)
[facility] Progress Note   Subjective:   Patient seen and examined at bedside in HD.  States treatment was tolerated well.  Reports feeling better and wanting to go home.  Denies SOB, CP, fever, chills, n/v/d, weakness and fatigue.   Objective Vitals:   05/16/18 0930 05/16/18 1000 05/16/18 1030 05/16/18 1100  BP: (!) 168/84 (!) 167/93 (!) 175/91 (!) 163/93  Pulse: 81 82 84 83  Resp:      Temp:      TempSrc:      SpO2:      Weight:      Height:       Physical Exam General:NAD, well appearing male Heart:RRR, no MRG Lungs:CTAB anteriorly   Abdomen:soft, NTND Extremities: no LE edema Dialysis Access: L IJ TDC, RU AVF +b/t   Filed Weights   05/14/18 1554 05/16/18 0705  Weight: 79.3 kg 81.8 kg   No intake or output data in the 24 hours ending 05/16/18 1201  Additional Objective Labs: Basic Metabolic Panel: Recent Labs  Lab 05/14/18 1138 05/14/18 1148 05/16/18 0233  NA 137 135 137  K 3.7 3.5 4.2  CL 97*  --  98  CO2 27  --  26  GLUCOSE 96  --  182*  BUN 13  --  33*  CREATININE 3.24*  --  5.34*  CALCIUM 7.7*  --  8.2*   Liver Function Tests: Recent Labs  Lab 05/14/18 1138  AST 33  ALT 26  ALKPHOS 116  BILITOT 0.5  PROT 6.8  ALBUMIN 2.8*   No results for input(s): LIPASE, AMYLASE in the last 168 hours. CBC: Recent Labs  Lab 05/14/18 1138 05/14/18 1148 05/16/18 0233  WBC 10.8*  --  8.5  NEUTROABS 10.2*  --   --   HGB 7.4* 8.5* 7.2*  HCT 24.7* 25.0* 24.8*  MCV 87.9  --  86.4  PLT 297  --  319   Blood Culture    Component Value Date/Time   SDES BLOOD LEFT HAND 05/14/2018 1150   SPECREQUEST  05/14/2018 1150    BOTTLES DRAWN AEROBIC AND ANAEROBIC Blood Culture results may not be optimal due to an excessive volume of blood received in culture bottles   CULT (A) 05/14/2018 1150    KLEBSIELLA PNEUMONIAE SUSCEPTIBILITIES TO FOLLOW Performed at [place] [address], [place], [place] [address]    REPTSTATUS PENDING  05/14/2018 1150   CBG: Recent Labs  Lab 05/15/18 1154 05/15/18 1627 05/15/18 2153 05/16/18 0617 05/16/18 0817  GLUCAP 177* 213* 142* 191* 139*    Medications: . aztreonam 0.5 g (05/16/18 1152)   . calcitRIOL  0.25 mcg Oral QHS  . heparin  5,000 Units Subcutaneous Q8H  . insulin aspart  0-5 Units Subcutaneous QHS  . insulin aspart  0-9 Units Subcutaneous TID WC  . multivitamin  1 tablet Oral QHS  . pantoprazole  40 mg Oral BID    Dialysis Orders: [facility]  on MWF . EDW 77 kg HD Bath 2k, 2.5 ca  Time 4hr Heparin none (ok in pc flush). Access L IJ Perm cath  ( RUA AVF 108/20 Insert)  Calcitriol 0.5 mcg poHD  Mircera 150 mcg q2wks ( last given 02/05)  Last  Op center hgb 6.8 (2/05)hgb  7.5  (1/29)hgb 6.9 ( 05/05/18)  Venofer  100 mg  q hd load (start 02/05 last dose 06/04/18)  Assessment/Plan: 1. Sepsis w/Klebsiella pneumonia bacteremia & RLL PNA -  Afebrile last 24hrs. Has HD cath,  does not appear to be source of infection at this time. On ABX - primary to transition to cefepime while awaiting sensitivities. 2. ESRD - on HD MWF.  HD today tolerated well.  Next HD on Monday per regular schedule. K 4.2.  3. Anemia of CKD- Hgb 7.2. ESA recently dosed, increase dose at d/c. Holding Fe due to infection.  Transfuse as needed.   4. Secondary hyperparathyroidism - Ca in goal. OP phos a little low.  Will recheck.  Continue VDRA.  No binders. 5. HTN/volume - BP elevated.  Continue home meds.  Does not appear volume overloaded on exam but not to EDW.  Net UF removed today 2.5L.  6. Nutrition - Renal diet w/fluid restrictions. 7. Seizure d/o - per primary 8. DMT2 = per primary 9. Chronic diarrhea - per primary   Jen Mow, PA-C [facility] Pager: [phone] 05/16/2018,12:01 PM  LOS: 2 days

## 2018-05-16 NOTE — Progress Notes (Signed)
Patient has been refusing heparin. Education has been provided and asked why he is refusing he stated he doesn't take it and he is not going to take it.

## 2018-05-16 NOTE — Procedures (Signed)
Patient was seen on dialysis and the procedure was supervised.  BFR 400  Via TDC, site clean BP is  153/70.   Patient appears to be tolerating treatment well  Nathen Balaban Tanna Furry 05/16/2018

## 2018-05-16 NOTE — Progress Notes (Signed)
Temp rechecked 99.5, Patient tolerated ABT, no adverse reaction noted. Patient sitting on side of bed eating supper. Denies SOB. Will cont to monitor.

## 2018-05-16 NOTE — Clinical Social Work Note (Signed)
Clinical Social Work Assessment  Patient Details  Name: Eddie Watson MRN: [id number] Date of Birth: 09/02/1955  Date of referral:  05/16/18               Reason for consult:  Discharge Planning                Permission sought to share information with:  Case Manager, Facility Sport and exercise psychologist Permission granted to share information::  Yes, Verbal Permission Granted  Name::        Agency::  SNFs  Relationship::     Contact Information:     Housing/Transportation Living arrangements for the past 2 months:  Skilled Nursing Facility([facility]) Source of Information:  Patient Patient Interpreter Needed:  None Criminal Activity/Legal Involvement Pertinent to Current Situation/Hospitalization:  No - Comment as needed Significant Relationships:  None Lives with:  Facility Resident Do you feel safe going back to the place where you live?  Yes Need for family participation in patient care:  No (Coment)  Care giving concerns:  CSW received referral for possible SNF placement at time of discharge. Spoke with patient regarding possibility of SNF placement . Patient has no family to care for him at their home given patient's current needs and fall risk.  Patient expressed understanding of PT recommendation and are agreeable to SNF placement at time of discharge. CSW to continue to follow and assist with discharge planning needs.     Social Worker assessment / plan:  Spoke with patient concerning possibility of rehab at SNF before returning home. Patient from [place] and eager to return there.   Employment status:  Retired Forensic scientist:  Medicaid In [place] PT Recommendations:  [place] / Referral to community resources:  [place]  Patient/Family's Response to care:  Patient recognize need for rehab before returning home and are agreeable to a SNF in [place]. They report preference for returning to [place] and are eager to do  so    . CSW explained insurance authorization process. Patient's family reported that they want patient to get stronger to be able to come back home.    Patient/Family's Understanding of and Emotional Response to Diagnosis, Current Treatment, and Prognosis:  Patient/family is realistic regarding therapy needs and expressed being hopeful for SNF placement. Patient expressed understanding of CSW role and discharge process as well as medical condition. No questions/concerns about plan or treatment.   Emotional Assessment Appearance:  Appears stated age Attitude/Demeanor/Rapport:  Apprehensive Affect (typically observed):  Agitated Orientation:  Oriented to Self, Oriented to Place, Oriented to  Time, Oriented to Situation Alcohol / Substance use:  Not Applicable Psych involvement (Current and /or in the community):  No (Comment)  Discharge Needs  Concerns to be addressed:  Discharge Planning Concerns Readmission within the last 30 days:  No Current discharge risk:  Dependent with Mobility Barriers to Discharge:  Continued Medical Work up   [employer], Lupus 05/16/2018, 12:23 PM

## 2018-05-17 ENCOUNTER — Encounter (HOSPITAL_COMMUNITY): Payer: Self-pay | Admitting: Pharmacist Clinician (PhC)/ Clinical Pharmacy Specialist

## 2018-05-17 LAB — CBC
HCT: 26.1 % — ABNORMAL LOW (ref 39.0–52.0)
Hemoglobin: 7.6 g/dL — ABNORMAL LOW (ref 13.0–17.0)
MCH: 25.2 pg — ABNORMAL LOW (ref 26.0–34.0)
MCHC: 29.1 g/dL — ABNORMAL LOW (ref 30.0–36.0)
MCV: 86.7 fL (ref 80.0–100.0)
Platelets: 366 10*3/uL (ref 150–400)
RBC: 3.01 MIL/uL — ABNORMAL LOW (ref 4.22–5.81)
RDW: 17.4 % — AB (ref 11.5–15.5)
WBC: 9.6 10*3/uL (ref 4.0–10.5)
nRBC: 0 % (ref 0.0–0.2)

## 2018-05-17 LAB — GLUCOSE, CAPILLARY: Glucose-Capillary: 196 mg/dL — ABNORMAL HIGH (ref 70–99)

## 2018-05-17 MED ORDER — CEPHALEXIN 500 MG PO CAPS: 500.0000 mg | ORAL_CAPSULE | ORAL | 0 refills | Status: AC

## 2018-05-17 NOTE — Discharge Summary (Addendum)
Discharge Summary  [place] AJG:[id number] DOB: 07-30-1955  PCP: Patient, No Pcp Per  Admit date: 05/14/2018 Discharge date: 05/17/2018   Time spent: < 25 minutes  Admitted From: SNF Disposition:  SNF  Recommendations for Outpatient Follow-up:  1. Follow up with PCP in 1 week 2. ADDENDUM: Patient was discharged on Keflex 500 mg q24 hr(renally dosed) due to penicillin allergy.     Discharge Diagnoses:  Active Hospital Problems   Diagnosis Date Noted  . Sepsis ([place]) 05/14/2018  . Bacteremia due to Klebsiella pneumoniae 05/15/2018  . Seizure ([place]) 05/14/2018  . ESRD (end stage renal disease) ([place]) 05/14/2018  . HTN (hypertension) 09/23/2013  . Chronic diarrhea 09/23/2013  . Diabetes ([place]) 09/23/2013    Resolved Hospital Problems  No resolved problems to display.    Discharge Condition: Stable   CODE STATUS:FULL CODE   History of present illness:  Eddie Watson is a 63 y.o. 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 Remaining hospital course addressed in problem based format below:   Hospital Course:   1. Sepsis secondary to Klebsiella pneumonia with bacteremia.  On admission TMax of 103.4, HR 108, with white count of 10.8.  Chest x-ray shows right lower lobe findings concerning for pneumonia. Iniitally required Bipap due to respiratory distress (VBG with normal Co2 and Ph) but was able to quickly wean to room air once arriving on medical floor from ED. Empirically started on vancomycin and aztreonam( penicillin allergy). His sepsis physiology resolved with IV antibiotics Blood cultures were positive for Klebsiella Pneumoniae prompting discontinuation of vancomycin, he was monitored on  Cefepime over 24 hours and had no allergic reaction.   During his HD session nephrology evaluated his port and were not concerned for catheter related infection. Last fever was 2/7 in the afternoon.  Based on sensitivities he will be discharged on Keflex renally dosed to complete 14 total days of antibiotics  2. Hypotension, resolved.  On admission his systolic blood pressures ranging 98-100 with no lactic acidosis. Likely a result of sepsis physiology from his pneumonia and bacteremia.   Briefly held his home BP meds before resuming after BP returned to his baseline.    3. Hypertension, Stable. Initially held BP meds on admission with Systolics in 355H likely related to sepsis to pneumoniae. BP medications were resumed during hospitalization as his BP returned to normal within 24 hours  4. ESRD on HD, MWF.  Tolerated HD session on 2/5 prior to admissions. Tolerated session in house on 2/7.  Remains  compensated on exam.  Continue calcitriol and multivitamin.  Resume home torsemide and HD scheduled on discharge  5. GERD, stable.  Continue home Protonix  6. Type 2 diabetes, well controlled (A1c 7) monitored on Sliding scale in hospital. Resume home medications  7. Reported seizure disorder.  Patient denies taking any phenytoin and not on home medication reconciliation, will continue to monitor.  8. Anemia of chronic disease, stable.  Last documented hemoglobin of 11 (04/28/2014) 7.4 initially on admission remained stable at 7.8 on day of discharge.  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  No active signs or symptoms of bleeding.  9. Chronic diarrhea, stable.  Continue home Imodium    Consultations:  nephrology  Procedures/Studies: none  Discharge Exam: BP (!) 142/79 (  BP Location: Left Arm)   Pulse 70   Temp 98.5 F (36.9 C) (Oral)   Resp 16   Ht 6' (1.829 m)   Wt 79.5 kg   SpO2 97%   BMI 23.77 kg/m    General: Lying in bed comfortably, no acute  distress  Cardiovascular: Regular rate and rhythm, no appreciable murmurs rubs or gallops, no peripheral edema  Respiratory: Normal respiratory effort on room air, no crackles or wheezes  Abdomen: Soft abdomen, nondistended, normal bowel sounds  Musculoskeletal: Normal range of motion  Skin port in place in left chest with no surrounding edema, AV fistula in place in right upper arm with palpable thrill and audible bruit  Neurologic alert oriented x4, no appreciable focal deficits    Discharge Instructions You were cared for by a hospitalist during your hospital stay. If you have any questions about your discharge medications or the care you received while you were in the hospital after you are discharged, you can call the unit and asked to speak with the hospitalist on call if the hospitalist that took care of you is not available. Once you are discharged, your primary care physician will handle any further medical issues. Please note that NO REFILLS for any discharge medications will be authorized once you are discharged, as it is imperative that you return to your primary care physician (or establish a relationship with a primary care physician if you do not have one) for your aftercare needs so that they can reassess your need for medications and monitor your lab values.  Discharge Instructions    Diet - low sodium heart healthy   Complete by:  As directed    Increase activity slowly   Complete by:  As directed      Allergies as of 05/17/2018      Reactions   Penicillins Anaphylaxis   Has patient had a PCN reaction causing immediate rash, facial/tongue/throat swelling, SOB or lightheadedness with hypotension: Yes Has patient had a PCN reaction causing severe rash involving mucus membranes or skin necrosis: Yes Has patient had a PCN reaction that required hospitalization Yes Has patient had a PCN reaction occurring within the last 10 years:unknown If all of the above answers are  "NO", then may proceed with Cephalosporin use.   Tomato    Face swells up like a basketball      Medication List    TAKE these medications   acetaminophen 325 MG tablet Commonly known as:  TYLENOL Take 650 mg by mouth every 6 (six) hours as needed for mild pain, moderate pain or headache.   b complex-vitamin c-folic acid 0.8 MG Tabs tablet Take 1 tablet by mouth at bedtime.   calcitRIOL 0.25 MCG capsule Commonly known as:  ROCALTROL Take 0.25 mcg by mouth at bedtime.   cephALEXin 500 MG capsule Commonly known as:  KEFLEX Take 1 capsule (500 mg total) by mouth daily for 11 days.   cloNIDine 0.1 MG tablet Commonly known as:  CATAPRES Take 0.1 mg by mouth as needed (Blood pressure over 180).   feeding supplement (PRO-STAT SUGAR FREE 64) Liqd Take 30 mLs by mouth at bedtime. Wild cherry   hydrALAZINE 10 MG tablet Commonly known as:  APRESOLINE Take 10 mg by mouth every 4 (four) hours as needed (BLOOD PRESSURE GREATER THAN 180).   labetalol 200 MG tablet Commonly known as:  NORMODYNE Take 200 mg by mouth 2 (two) times daily.   loperamide 2 MG capsule Commonly known as:  IMODIUM Take 2 mg by mouth as needed for diarrhea or loose stools.   montelukast 10 MG tablet Commonly known as:  SINGULAIR Take 10 mg by mouth at bedtime.   nitroGLYCERIN 0.4 MG SL tablet Commonly known as:  NITROSTAT Place 0.4 mg under the tongue every 5 (five) minutes as needed for chest pain.   NORVASC 10 MG tablet Generic drug:  amLODipine Take 10 mg by mouth daily.   OS-CAL PO Take 500 mg by mouth 3 (three) times daily.   pantoprazole 40 MG tablet Commonly known as:  PROTONIX Take 40 mg by mouth 2 (two) times daily.   torsemide 20 MG tablet Commonly known as:  DEMADEX Take 40 mg by mouth daily.      Allergies  Allergen Reactions  . Penicillins Anaphylaxis    Has patient had a PCN reaction causing immediate rash, facial/tongue/throat swelling, SOB or lightheadedness with  hypotension: Yes Has patient had a PCN reaction causing severe rash involving mucus membranes or skin necrosis: Yes Has patient had a PCN reaction that required hospitalization Yes Has patient had a PCN reaction occurring within the last 10 years:unknown If all of the above answers are "NO", then may proceed with Cephalosporin use.   . Tomato     Face swells up like a basketball   Contact information for after-discharge care    Destination    [facility] .   Service:  Skilled Nursing Contact information: [address] [place] [phone]               The results of significant diagnostics from this hospitalization (including imaging, microbiology, ancillary and laboratory) are listed below for reference.    Significant Diagnostic Studies: Dg Chest Port 1 View  Result Date: 05/14/2018 CLINICAL DATA:  Hypoxia.  Renal failure EXAM: PORTABLE CHEST 1 VIEW COMPARISON:  March 05, 2018. FINDINGS: Central catheter tip is at the cavoatrial junction. No pneumothorax. There is a right pleural effusion with consolidation in portions of the right middle and lower lobes. The left lung is clear. Heart is mildly enlarged with pulmonary vascularity normal. No adenopathy. No bone lesions. IMPRESSION: Central catheter as described without pneumothorax. Right pleural effusion with consolidation in portions of the right middle and lower lobes. Left lung clear. Mild cardiac enlargement. Electronically Signed   By: Lowella Grip III M.D.   On: 05/14/2018 11:38    Microbiology: Recent Results (from the past 240 hour(s))  Blood Culture (routine x 2)     Status: None (Preliminary result)   Collection Time: 05/14/18 11:30 AM  Result Value Ref Range Status   Specimen Description BLOOD LEFT HAND  Final   Special Requests   Final    BOTTLES DRAWN AEROBIC AND ANAEROBIC Blood Culture adequate volume   Culture  Setup Time   Final    ANAEROBIC BOTTLE ONLY GRAM  NEGATIVE RODS CRITICAL VALUE NOTED.  VALUE IS CONSISTENT WITH PREVIOUSLY REPORTED AND CALLED VALUE. Performed at [facility], [place] [address] [place], [place] [address]    Culture GRAM NEGATIVE RODS  Final   Report Status PENDING  Incomplete  Blood Culture (routine x 2)     Status: Abnormal (Preliminary result)   Collection Time: 05/14/18 11:50 AM  Result Value Ref Range Status   Specimen Description BLOOD LEFT HAND  Final   Special Requests   Final    BOTTLES DRAWN AEROBIC AND ANAEROBIC Blood Culture results may not be optimal due to an excessive volume  of blood received in culture bottles   Culture  Setup Time   Final    GRAM NEGATIVE RODS IN BOTH AEROBIC AND ANAEROBIC BOTTLES CRITICAL RESULT CALLED TO, READ BACK BY AND VERIFIED WITH: Ellin Mayhew PHAM M PHAM [phone] MLM    Culture (A)  Final    KLEBSIELLA PNEUMONIAE Sent to [place] for further susceptibility testing. Performed at [facility], [place] [address], [place], [place] [address]    Report Status PENDING  Incomplete   Organism ID, Bacteria KLEBSIELLA PNEUMONIAE  Final      Susceptibility   Klebsiella pneumoniae - MIC*    AMPICILLIN >=32 RESISTANT Resistant     CEFAZOLIN <=4 SENSITIVE Sensitive     CEFEPIME <=1 SENSITIVE Sensitive     CEFTAZIDIME <=1 SENSITIVE Sensitive     CEFTRIAXONE <=1 SENSITIVE Sensitive     CIPROFLOXACIN <=0.25 SENSITIVE Sensitive     GENTAMICIN <=1 SENSITIVE Sensitive     IMIPENEM <=0.25 SENSITIVE Sensitive     TRIMETH/SULFA <=20 SENSITIVE Sensitive     AMPICILLIN/SULBACTAM 4 SENSITIVE Sensitive     PIP/TAZO <=4 SENSITIVE Sensitive     Extended ESBL NEGATIVE Sensitive     * KLEBSIELLA PNEUMONIAE  Blood Culture ID Panel (Reflexed)     Status: Abnormal   Collection Time: 05/14/18 11:50 AM  Result Value Ref Range Status   Enterococcus species NOT DETECTED NOT DETECTED Final   Listeria monocytogenes NOT DETECTED NOT DETECTED Final   Staphylococcus species NOT DETECTED NOT DETECTED  Final   Staphylococcus aureus (BCID) NOT DETECTED NOT DETECTED Final   Streptococcus species NOT DETECTED NOT DETECTED Final   Streptococcus agalactiae NOT DETECTED NOT DETECTED Final   Streptococcus pneumoniae NOT DETECTED NOT DETECTED Final   Streptococcus pyogenes NOT DETECTED NOT DETECTED Final   Acinetobacter baumannii NOT DETECTED NOT DETECTED Final   Enterobacteriaceae species DETECTED (A) NOT DETECTED Final    Comment: Enterobacteriaceae represent a large family of gram-negative bacteria, not a single organism. CRITICAL RESULT CALLED TO, READ BACK BY AND VERIFIED WITH: PHARMD M PHAM [phone] M LM    Enterobacter cloacae complex NOT DETECTED NOT DETECTED Final   Escherichia coli NOT DETECTED NOT DETECTED Final   Klebsiella oxytoca NOT DETECTED NOT DETECTED Final   Klebsiella pneumoniae DETECTED (A) NOT DETECTED Final    Comment: CRITICAL RESULT CALLED TO, READ BACK BY AND VERIFIED WITH: PHARMD M PHAM [phone] MLM    Proteus species NOT DETECTED NOT DETECTED Final   Serratia marcescens NOT DETECTED NOT DETECTED Final   Carbapenem resistance NOT DETECTED NOT DETECTED Final   Haemophilus influenzae NOT DETECTED NOT DETECTED Final   Neisseria meningitidis NOT DETECTED NOT DETECTED Final   Pseudomonas aeruginosa NOT DETECTED NOT DETECTED Final   Candida albicans NOT DETECTED NOT DETECTED Final   Candida glabrata NOT DETECTED NOT DETECTED Final   Candida krusei NOT DETECTED NOT DETECTED Final   Candida parapsilosis NOT DETECTED NOT DETECTED Final   Candida tropicalis NOT DETECTED NOT DETECTED Final    Comment: Performed at [facility], [place] [address], [place], [place] [address]     Labs: Basic Metabolic Panel: Recent Labs  Lab 05/14/18 1138 05/14/18 1148 05/16/18 0233  NA 137 135 137  K 3.7 3.5 4.2  CL 97*  --  98  CO2 27  --  26  GLUCOSE 96  --  182*  BUN 13  --  33*  CREATININE 3.24*  --  5.34*  CALCIUM 7.7*  --  8.2*   Liver Function Tests: Recent  Labs  Lab 05/14/18 1138  AST 33  ALT 26  ALKPHOS 116  BILITOT 0.5  PROT 6.8  ALBUMIN 2.8*   No results for input(s): LIPASE, AMYLASE in the last 168 hours. No results for input(s): AMMONIA in the last 168 hours. CBC: Recent Labs  Lab 05/14/18 1138 05/14/18 1148 05/16/18 0233 05/17/18 0658  WBC 10.8*  --  8.5 9.6  NEUTROABS 10.2*  --   --   --   HGB 7.4* 8.5* 7.2* 7.6*  HCT 24.7* 25.0* 24.8* 26.1*  MCV 87.9  --  86.4 86.7  PLT 297  --  319 366   Cardiac Enzymes: No results for input(s): CKTOTAL, CKMB, CKMBINDEX, TROPONINI in the last 168 hours. BNP: BNP (last 3 results) No results for input(s): BNP in the last 8760 hours.  ProBNP (last 3 results) No results for input(s): PROBNP in the last 8760 hours.  CBG: Recent Labs  Lab 05/16/18 0817 05/16/18 1229 05/16/18 1608 05/16/18 2043 05/17/18 0629  GLUCAP 139* 112* 106* 156* 196*       Signed:  Desiree Hane, MD [facility] 05/17/2018, 9:24 AM

## 2018-05-17 NOTE — Progress Notes (Signed)
Patient will DC to: [place] Anticipated DC date:05/17/2018 Family notified:none per patient Transport QD:[facility]  Note: Patient has left before CSW faxed dc summary to facility, CSW was not notified of patient leaving. CSW requested nurse to notify CSW when this occurs as call needs to be made for report and facility needs to have dc summary in hand when patient arrives at [place].    Per MD patient ready for DC to [facility] . RN, patient, patient's family, and facility notified of DC. Discharge Summary sent to facility. RN given number for report [phone] Room 417B. DC packet on chart.  CSW signing off.  [place], [place]

## 2018-05-17 NOTE — Plan of Care (Signed)

## 2018-05-17 NOTE — Clinical Social Work Placement (Signed)
   CLINICAL SOCIAL WORK PLACEMENT  NOTE  Date:  05/17/2018  Patient Details  Name: Eddie Watson MRN: [id number] Date of Birth: [birth year]  Clinical Social Work is seeking post-discharge placement for this patient at the [place] level of care (*CSW will initial, date and re-position this form in  chart as items are completed):      Patient/family provided with [place] Work Department's list of facilities offering this level of care within the geographic area requested by the patient (or if unable, by the patient's family).  Yes   Patient/family informed of their freedom to choose among providers that offer the needed level of care, that participate in Medicare, Medicaid or managed care program needed by the patient, have an available bed and are willing to accept the patient.      Patient/family informed of [facility]'s ownership interest in [facility] and [facility], as well as of the fact that they are under no obligation to receive care at these facilities.  PASRR submitted to EDS on       PASRR number received on 05/16/18     Existing PASRR number confirmed on       FL2 transmitted to all facilities in geographic area requested by pt/family on 05/16/18     FL2 transmitted to all facilities within larger geographic area on       Patient informed that his/her managed care company has contracts with or will negotiate with certain facilities, including the following:            Patient/family informed of bed offers received.  Patient chooses bed at [facility]     Physician recommends and patient chooses bed at      Patient to be transferred to [place] on 05/17/18.  Patient to be transferred to facility by own [facility]     Patient family notified on 05/17/18 of transfer.  Name of family member notified:  none per patient request     PHYSICIAN       Additional Comment:     _______________________________________________ Alberteen Sam, LCSW 05/17/2018, 10:12 AM

## 2018-05-18 LAB — CULTURE, BLOOD (ROUTINE X 2): Special Requests: ADEQUATE

## 2018-05-20 LAB — MINIMUM INHIBITORY CONC. (1 DRUG)

## 2018-05-20 LAB — MIC RESULT

## 2018-05-23 LAB — CULTURE, BLOOD (ROUTINE X 2)

## 2018-05-28 ENCOUNTER — Encounter: Payer: Self-pay | Admitting: Nephrology

## 2018-09-23 ENCOUNTER — Inpatient Hospital Stay (HOSPITAL_COMMUNITY): Payer: Medicaid Other

## 2018-09-23 ENCOUNTER — Inpatient Hospital Stay (HOSPITAL_COMMUNITY)
Admission: EM | Admit: 2018-09-23 | Discharge: 2018-10-08 | DRG: 640 | Disposition: A | Discharge status: Home / Self Care | Payer: Medicaid Other | Source: Other Acute Inpatient Hospital | Attending: Family Medicine | Admitting: Family Medicine

## 2018-09-23 ENCOUNTER — Encounter (HOSPITAL_COMMUNITY): Payer: Self-pay | Admitting: Emergency Medicine

## 2018-09-23 ENCOUNTER — Other Ambulatory Visit: Payer: Self-pay

## 2018-09-23 DIAGNOSIS — E1122 Type 2 diabetes mellitus with diabetic chronic kidney disease: Secondary | ICD-10-CM | POA: Diagnosis present

## 2018-09-23 DIAGNOSIS — Z590 Homelessness: Secondary | ICD-10-CM

## 2018-09-23 DIAGNOSIS — E872 Acidosis: Secondary | ICD-10-CM | POA: Diagnosis present

## 2018-09-23 DIAGNOSIS — S6991XA Unspecified injury of right wrist, hand and finger(s), initial encounter: Secondary | ICD-10-CM

## 2018-09-23 DIAGNOSIS — Z79899 Other long term (current) drug therapy: Secondary | ICD-10-CM

## 2018-09-23 DIAGNOSIS — S61314A Laceration without foreign body of right ring finger with damage to nail, initial encounter: Secondary | ICD-10-CM

## 2018-09-23 DIAGNOSIS — Z91199 Patient's noncompliance with other medical treatment and regimen due to unspecified reason: Secondary | ICD-10-CM

## 2018-09-23 DIAGNOSIS — Z0471 Encounter for examination and observation following alleged adult physical abuse: Secondary | ICD-10-CM | POA: Diagnosis present

## 2018-09-23 DIAGNOSIS — Z7901 Long term (current) use of anticoagulants: Secondary | ICD-10-CM

## 2018-09-23 DIAGNOSIS — R296 Repeated falls: Secondary | ICD-10-CM | POA: Diagnosis present

## 2018-09-23 DIAGNOSIS — E8770 Fluid overload, unspecified: Secondary | ICD-10-CM | POA: Diagnosis present

## 2018-09-23 DIAGNOSIS — I824Z1 Acute embolism and thrombosis of unspecified deep veins of right distal lower extremity: Secondary | ICD-10-CM | POA: Diagnosis not present

## 2018-09-23 DIAGNOSIS — Z221 Carrier of other intestinal infectious diseases: Secondary | ICD-10-CM

## 2018-09-23 DIAGNOSIS — Z659 Problem related to unspecified psychosocial circumstances: Secondary | ICD-10-CM | POA: Diagnosis not present

## 2018-09-23 DIAGNOSIS — H16002 Unspecified corneal ulcer, left eye: Secondary | ICD-10-CM | POA: Diagnosis present

## 2018-09-23 DIAGNOSIS — S61214A Laceration without foreign body of right ring finger without damage to nail, initial encounter: Secondary | ICD-10-CM | POA: Diagnosis present

## 2018-09-23 DIAGNOSIS — Z9119 Patient's noncompliance with other medical treatment and regimen: Secondary | ICD-10-CM | POA: Diagnosis not present

## 2018-09-23 DIAGNOSIS — H169 Unspecified keratitis: Secondary | ICD-10-CM | POA: Diagnosis present

## 2018-09-23 DIAGNOSIS — Z880 Allergy status to penicillin: Secondary | ICD-10-CM

## 2018-09-23 DIAGNOSIS — L089 Local infection of the skin and subcutaneous tissue, unspecified: Secondary | ICD-10-CM | POA: Diagnosis present

## 2018-09-23 DIAGNOSIS — W109XXA Fall (on) (from) unspecified stairs and steps, initial encounter: Secondary | ICD-10-CM | POA: Diagnosis present

## 2018-09-23 DIAGNOSIS — N2581 Secondary hyperparathyroidism of renal origin: Secondary | ICD-10-CM | POA: Diagnosis present

## 2018-09-23 DIAGNOSIS — R609 Edema, unspecified: Secondary | ICD-10-CM | POA: Diagnosis not present

## 2018-09-23 DIAGNOSIS — Z91018 Allergy to other foods: Secondary | ICD-10-CM | POA: Diagnosis not present

## 2018-09-23 DIAGNOSIS — R7989 Other specified abnormal findings of blood chemistry: Secondary | ICD-10-CM | POA: Insufficient documentation

## 2018-09-23 DIAGNOSIS — Z7952 Long term (current) use of systemic steroids: Secondary | ICD-10-CM

## 2018-09-23 DIAGNOSIS — D72829 Elevated white blood cell count, unspecified: Secondary | ICD-10-CM | POA: Diagnosis not present

## 2018-09-23 DIAGNOSIS — M25531 Pain in right wrist: Secondary | ICD-10-CM | POA: Diagnosis present

## 2018-09-23 DIAGNOSIS — I82561 Chronic embolism and thrombosis of right calf muscular vein: Secondary | ICD-10-CM | POA: Diagnosis present

## 2018-09-23 DIAGNOSIS — N186 End stage renal disease: Secondary | ICD-10-CM | POA: Diagnosis present

## 2018-09-23 DIAGNOSIS — I10 Essential (primary) hypertension: Secondary | ICD-10-CM | POA: Diagnosis not present

## 2018-09-23 DIAGNOSIS — S0083XA Contusion of other part of head, initial encounter: Secondary | ICD-10-CM | POA: Diagnosis present

## 2018-09-23 DIAGNOSIS — D631 Anemia in chronic kidney disease: Secondary | ICD-10-CM | POA: Diagnosis present

## 2018-09-23 DIAGNOSIS — Z992 Dependence on renal dialysis: Secondary | ICD-10-CM | POA: Diagnosis not present

## 2018-09-23 DIAGNOSIS — Z9115 Patient's noncompliance with renal dialysis: Secondary | ICD-10-CM | POA: Diagnosis not present

## 2018-09-23 DIAGNOSIS — Z87891 Personal history of nicotine dependence: Secondary | ICD-10-CM

## 2018-09-23 DIAGNOSIS — I5032 Chronic diastolic (congestive) heart failure: Secondary | ICD-10-CM | POA: Diagnosis present

## 2018-09-23 DIAGNOSIS — I2510 Atherosclerotic heart disease of native coronary artery without angina pectoris: Secondary | ICD-10-CM | POA: Diagnosis present

## 2018-09-23 DIAGNOSIS — S6991XS Unspecified injury of right wrist, hand and finger(s), sequela: Secondary | ICD-10-CM | POA: Diagnosis not present

## 2018-09-23 DIAGNOSIS — I132 Hypertensive heart and chronic kidney disease with heart failure and with stage 5 chronic kidney disease, or end stage renal disease: Secondary | ICD-10-CM | POA: Diagnosis present

## 2018-09-23 DIAGNOSIS — Z609 Problem related to social environment, unspecified: Secondary | ICD-10-CM

## 2018-09-23 DIAGNOSIS — R52 Pain, unspecified: Secondary | ICD-10-CM

## 2018-09-23 DIAGNOSIS — E8779 Other fluid overload: Secondary | ICD-10-CM | POA: Diagnosis not present

## 2018-09-23 DIAGNOSIS — A0472 Enterocolitis due to Clostridium difficile, not specified as recurrent: Secondary | ICD-10-CM | POA: Diagnosis present

## 2018-09-23 DIAGNOSIS — Z1159 Encounter for screening for other viral diseases: Secondary | ICD-10-CM | POA: Diagnosis not present

## 2018-09-23 DIAGNOSIS — J90 Pleural effusion, not elsewhere classified: Secondary | ICD-10-CM | POA: Diagnosis present

## 2018-09-23 DIAGNOSIS — Z9049 Acquired absence of other specified parts of digestive tract: Secondary | ICD-10-CM

## 2018-09-23 DIAGNOSIS — B1920 Unspecified viral hepatitis C without hepatic coma: Secondary | ICD-10-CM | POA: Diagnosis present

## 2018-09-23 HISTORY — DX: Disorder of kidney and ureter, unspecified: N28.9

## 2018-09-23 HISTORY — DX: Essential (primary) hypertension: I10

## 2018-09-23 LAB — CBC WITH DIFFERENTIAL/PLATELET
Abs Immature Granulocytes: 0.1 10*3/uL — ABNORMAL HIGH (ref 0.00–0.07)
Basophils Absolute: 0.1 10*3/uL (ref 0.0–0.1)
Basophils Relative: 0 %
Eosinophils Absolute: 0.2 10*3/uL (ref 0.0–0.5)
Eosinophils Relative: 1 %
HCT: 35.4 % — ABNORMAL LOW (ref 39.0–52.0)
Hemoglobin: 10.7 g/dL — ABNORMAL LOW (ref 13.0–17.0)
Immature Granulocytes: 1 %
Lymphocytes Relative: 6 %
Lymphs Abs: 1.1 10*3/uL (ref 0.7–4.0)
MCH: 24.9 pg — ABNORMAL LOW (ref 26.0–34.0)
MCHC: 30.2 g/dL (ref 30.0–36.0)
MCV: 82.3 fL (ref 80.0–100.0)
Monocytes Absolute: 1.1 10*3/uL — ABNORMAL HIGH (ref 0.1–1.0)
Monocytes Relative: 6 %
Neutro Abs: 15.2 10*3/uL — ABNORMAL HIGH (ref 1.7–7.7)
Neutrophils Relative %: 86 %
Platelets: 232 10*3/uL (ref 150–400)
RBC: 4.3 MIL/uL (ref 4.22–5.81)
RDW: 19.1 % — ABNORMAL HIGH (ref 11.5–15.5)
WBC: 17.8 10*3/uL — ABNORMAL HIGH (ref 4.0–10.5)
nRBC: 0 % (ref 0.0–0.2)

## 2018-09-23 LAB — BASIC METABOLIC PANEL
Anion gap: 16 — ABNORMAL HIGH (ref 5–15)
BUN: 82 mg/dL — ABNORMAL HIGH (ref 8–23)
CO2: 15 mmol/L — ABNORMAL LOW (ref 22–32)
Calcium: 7.4 mg/dL — ABNORMAL LOW (ref 8.9–10.3)
Chloride: 109 mmol/L (ref 98–111)
Creatinine, Ser: 11.09 mg/dL — ABNORMAL HIGH (ref 0.61–1.24)
GFR calc Af Amer: 5 mL/min — ABNORMAL LOW (ref >60)
GFR calc non Af Amer: 4 mL/min — ABNORMAL LOW (ref >60)
Glucose, Bld: 90 mg/dL (ref 70–99)
Potassium: 3.9 mmol/L (ref 3.5–5.1)
Sodium: 140 mmol/L (ref 135–145)

## 2018-09-23 LAB — HEMOGLOBIN A1C
Hgb A1c MFr Bld: 7.3 % — ABNORMAL HIGH (ref 4.8–5.6)
Mean Plasma Glucose: 162.81 mg/dL

## 2018-09-23 LAB — SARS CORONAVIRUS 2: SARS Coronavirus 2: NOT DETECTED

## 2018-09-23 MED ORDER — LIDOCAINE-PRILOCAINE 2.5-2.5 % EX CREA: 1.0000 "application " | TOPICAL_CREAM | CUTANEOUS | Status: DC | PRN

## 2018-09-23 MED ORDER — CALCIUM CARBONATE 1250 (500 CA) MG PO TABS
1.0000 | ORAL_TABLET | Freq: Every day | ORAL | Status: DC
  Administered 2018-09-23 – 2018-10-02 (×8): 500 mg via ORAL
  Filled 2018-09-23 (×15): qty 1

## 2018-09-23 MED ORDER — HEPARIN SODIUM (PORCINE) 1000 UNIT/ML IJ SOLN
4.2000 mL | Freq: Once | INTRAMUSCULAR | Status: AC
  Administered 2018-09-25: 4200 [IU] via INTRAVENOUS

## 2018-09-23 MED ORDER — LOPERAMIDE HCL 2 MG PO CAPS
2.0000 mg | ORAL_CAPSULE | ORAL | Status: DC | PRN
  Administered 2018-09-23 – 2018-10-08 (×59): 2 mg via ORAL
  Filled 2018-09-23 (×59): qty 1

## 2018-09-23 MED ORDER — AMLODIPINE BESYLATE 10 MG PO TABS
10.0000 mg | ORAL_TABLET | Freq: Every day | ORAL | Status: DC
  Administered 2018-09-24 – 2018-10-07 (×14): 10 mg via ORAL
  Filled 2018-09-23 (×15): qty 1

## 2018-09-23 MED ORDER — CHLORHEXIDINE GLUCONATE CLOTH 2 % EX PADS
6.0000 | MEDICATED_PAD | Freq: Every day | CUTANEOUS | Status: DC
  Administered 2018-09-24 – 2018-09-26 (×3): 6 via TOPICAL

## 2018-09-23 MED ORDER — HEPARIN SODIUM (PORCINE) 5000 UNIT/ML IJ SOLN
5000.0000 [IU] | Freq: Two times a day (BID) | INTRAMUSCULAR | Status: DC
  Administered 2018-09-23 – 2018-09-28 (×4): 5000 [IU] via SUBCUTANEOUS
  Filled 2018-09-23 (×18): qty 1

## 2018-09-23 MED ORDER — ALTEPLASE 2 MG IJ SOLR: 2.0000 mg | Freq: Once | INTRAMUSCULAR | Status: DC | PRN

## 2018-09-23 MED ORDER — ACETAMINOPHEN 650 MG RE SUPP: 650.0000 mg | Freq: Four times a day (QID) | RECTAL | Status: DC | PRN

## 2018-09-23 MED ORDER — PANTOPRAZOLE SODIUM 40 MG PO TBEC
40.0000 mg | DELAYED_RELEASE_TABLET | Freq: Two times a day (BID) | ORAL | Status: DC
  Administered 2018-09-23 – 2018-10-07 (×28): 40 mg via ORAL
  Filled 2018-09-23 (×28): qty 1

## 2018-09-23 MED ORDER — LOPERAMIDE HCL 2 MG PO CAPS
4.0000 mg | ORAL_CAPSULE | Freq: Once | ORAL | Status: AC
  Administered 2018-09-23: 4 mg via ORAL
  Filled 2018-09-23: qty 2

## 2018-09-23 MED ORDER — SODIUM CHLORIDE 0.9 % IV SOLN: 100.0000 mL | INTRAVENOUS | Status: DC | PRN

## 2018-09-23 MED ORDER — CALCITRIOL 0.5 MCG PO CAPS
0.5000 ug | ORAL_CAPSULE | ORAL | Status: DC
  Administered 2018-09-24: 0.5 ug via ORAL
  Filled 2018-09-23: qty 1

## 2018-09-23 MED ORDER — ERYTHROMYCIN 5 MG/GM OP OINT
1.0000 "application " | TOPICAL_OINTMENT | Freq: Every day | OPHTHALMIC | Status: DC
  Administered 2018-09-24 – 2018-10-07 (×15): 1 via OPHTHALMIC
  Filled 2018-09-23: qty 3.5

## 2018-09-23 MED ORDER — LIDOCAINE HCL (PF) 1 % IJ SOLN: 5.0000 mL | INTRAMUSCULAR | Status: DC | PRN

## 2018-09-23 MED ORDER — BRIMONIDINE TARTRATE 0.2 % OP SOLN
1.0000 [drp] | Freq: Two times a day (BID) | OPHTHALMIC | Status: DC
  Administered 2018-09-24 – 2018-10-07 (×28): 1 [drp] via OPHTHALMIC
  Filled 2018-09-23 (×2): qty 5

## 2018-09-23 MED ORDER — HEPARIN SODIUM (PORCINE) 1000 UNIT/ML DIALYSIS: 1000.0000 [IU] | INTRAMUSCULAR | Status: DC | PRN

## 2018-09-23 MED ORDER — PREDNISOLONE ACETATE 1 % OP SUSP
1.0000 [drp] | Freq: Two times a day (BID) | OPHTHALMIC | Status: DC
  Administered 2018-09-23 – 2018-10-07 (×28): 1 [drp] via OPHTHALMIC
  Filled 2018-09-23: qty 5

## 2018-09-23 MED ORDER — ACETAMINOPHEN 325 MG PO TABS
650.0000 mg | ORAL_TABLET | Freq: Four times a day (QID) | ORAL | Status: DC | PRN
  Administered 2018-09-25 – 2018-10-02 (×5): 650 mg via ORAL
  Filled 2018-09-23 (×5): qty 2

## 2018-09-23 MED ORDER — PENTAFLUOROPROP-TETRAFLUOROETH EX AERO: 1.0000 "application " | INHALATION_SPRAY | CUTANEOUS | Status: DC | PRN

## 2018-09-23 MED ORDER — TIMOLOL MALEATE 0.5 % OP SOLN
1.0000 [drp] | Freq: Two times a day (BID) | OPHTHALMIC | Status: DC
  Administered 2018-09-23 – 2018-10-07 (×28): 1 [drp] via OPHTHALMIC
  Filled 2018-09-23: qty 5

## 2018-09-23 MED ORDER — HEPARIN SODIUM (PORCINE) 1000 UNIT/ML IJ SOLN
INTRAMUSCULAR | Status: AC
  Filled 2018-09-23: qty 5

## 2018-09-23 MED ORDER — DORZOLAMIDE HCL 2 % OP SOLN
1.0000 [drp] | Freq: Two times a day (BID) | OPHTHALMIC | Status: DC
  Administered 2018-09-24 – 2018-10-07 (×28): 1 [drp] via OPHTHALMIC
  Filled 2018-09-23: qty 10

## 2018-09-23 MED ORDER — ACETAMINOPHEN 325 MG PO TABS
650.0000 mg | ORAL_TABLET | Freq: Once | ORAL | Status: AC
  Administered 2018-09-23: 650 mg via ORAL
  Filled 2018-09-23: qty 2

## 2018-09-23 MED ORDER — LATANOPROST 0.005 % OP SOLN
1.0000 [drp] | Freq: Every day | OPHTHALMIC | Status: DC
  Administered 2018-09-24 – 2018-10-07 (×15): 1 [drp] via OPHTHALMIC
  Filled 2018-09-23 (×2): qty 2.5

## 2018-09-23 NOTE — Significant Event (Signed)
For 6/16 at 1900 to 6/17 at 0700, please use pager [phone].  Pager [phone] is not working.  [place] Constable, D.O.  PGY-1 Family Medicine  09/23/2018 7:46 PM
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
## 2018-09-24 ENCOUNTER — Encounter (HOSPITAL_COMMUNITY): Payer: Self-pay | Admitting: *Deleted

## 2018-09-24 ENCOUNTER — Inpatient Hospital Stay (HOSPITAL_COMMUNITY): Payer: Medicaid Other

## 2018-09-24 ENCOUNTER — Other Ambulatory Visit: Payer: Self-pay

## 2018-09-24 DIAGNOSIS — Z9119 Patient's noncompliance with other medical treatment and regimen: Secondary | ICD-10-CM

## 2018-09-24 DIAGNOSIS — Z992 Dependence on renal dialysis: Secondary | ICD-10-CM

## 2018-09-24 DIAGNOSIS — E1122 Type 2 diabetes mellitus with diabetic chronic kidney disease: Secondary | ICD-10-CM

## 2018-09-24 DIAGNOSIS — E8779 Other fluid overload: Secondary | ICD-10-CM

## 2018-09-24 DIAGNOSIS — R609 Edema, unspecified: Secondary | ICD-10-CM

## 2018-09-24 DIAGNOSIS — N186 End stage renal disease: Secondary | ICD-10-CM

## 2018-09-24 DIAGNOSIS — Z659 Problem related to unspecified psychosocial circumstances: Secondary | ICD-10-CM

## 2018-09-24 DIAGNOSIS — R7989 Other specified abnormal findings of blood chemistry: Secondary | ICD-10-CM

## 2018-09-24 DIAGNOSIS — Z91199 Patient's noncompliance with other medical treatment and regimen due to unspecified reason: Secondary | ICD-10-CM

## 2018-09-24 LAB — CBC
HCT: 34.5 % — ABNORMAL LOW (ref 39.0–52.0)
Hemoglobin: 10.4 g/dL — ABNORMAL LOW (ref 13.0–17.0)
MCH: 24.8 pg — ABNORMAL LOW (ref 26.0–34.0)
MCHC: 30.1 g/dL (ref 30.0–36.0)
MCV: 82.1 fL (ref 80.0–100.0)
Platelets: 251 10*3/uL (ref 150–400)
RBC: 4.2 MIL/uL — ABNORMAL LOW (ref 4.22–5.81)
RDW: 19.3 % — ABNORMAL HIGH (ref 11.5–15.5)
WBC: 10.5 10*3/uL (ref 4.0–10.5)
nRBC: 0 % (ref 0.0–0.2)

## 2018-09-24 LAB — RENAL FUNCTION PANEL
Albumin: 2.4 g/dL — ABNORMAL LOW (ref 3.5–5.0)
Anion gap: 13 (ref 5–15)
BUN: 49 mg/dL — ABNORMAL HIGH (ref 8–23)
CO2: 18 mmol/L — ABNORMAL LOW (ref 22–32)
Calcium: 7.8 mg/dL — ABNORMAL LOW (ref 8.9–10.3)
Chloride: 108 mmol/L (ref 98–111)
Creatinine, Ser: 8.11 mg/dL — ABNORMAL HIGH (ref 0.61–1.24)
GFR calc Af Amer: 7 mL/min — ABNORMAL LOW (ref >60)
GFR calc non Af Amer: 6 mL/min — ABNORMAL LOW (ref >60)
Glucose, Bld: 91 mg/dL (ref 70–99)
Phosphorus: 4.7 mg/dL — ABNORMAL HIGH (ref 2.5–4.6)
Potassium: 3.5 mmol/L (ref 3.5–5.1)
Sodium: 139 mmol/L (ref 135–145)

## 2018-09-24 LAB — GLUCOSE, CAPILLARY
Glucose-Capillary: 128 mg/dL — ABNORMAL HIGH (ref 70–99)
Glucose-Capillary: 135 mg/dL — ABNORMAL HIGH (ref 70–99)
Glucose-Capillary: 71 mg/dL (ref 70–99)

## 2018-09-24 LAB — HIV ANTIBODY (ROUTINE TESTING W REFLEX): HIV Screen 4th Generation wRfx: NONREACTIVE

## 2018-09-24 LAB — PROTIME-INR
INR: 1.2 (ref 0.8–1.2)
Prothrombin Time: 14.8 seconds (ref 11.4–15.2)

## 2018-09-24 LAB — MRSA PCR SCREENING: MRSA by PCR: NEGATIVE

## 2018-09-24 MED ORDER — HEPARIN SODIUM (PORCINE) 1000 UNIT/ML IJ SOLN
INTRAMUSCULAR | Status: AC
  Administered 2018-09-24: 21:00:00
  Filled 2018-09-24: qty 5

## 2018-09-24 MED ORDER — INSULIN ASPART 100 UNIT/ML ~~LOC~~ SOLN
0.0000 [IU] | Freq: Three times a day (TID) | SUBCUTANEOUS | Status: DC
  Administered 2018-09-24 (×2): 1 [IU] via SUBCUTANEOUS
  Administered 2018-09-24 – 2018-09-26 (×3): 2 [IU] via SUBCUTANEOUS
  Administered 2018-09-26: 1 [IU] via SUBCUTANEOUS
  Administered 2018-09-28: 2 [IU] via SUBCUTANEOUS
  Administered 2018-09-29: 1 [IU] via SUBCUTANEOUS
  Administered 2018-09-29: 2 [IU] via SUBCUTANEOUS
  Administered 2018-09-29 – 2018-09-30 (×3): 1 [IU] via SUBCUTANEOUS
  Administered 2018-10-01: 2 [IU] via SUBCUTANEOUS
  Administered 2018-10-01 – 2018-10-03 (×4): 1 [IU] via SUBCUTANEOUS
  Administered 2018-10-04: 2 [IU] via SUBCUTANEOUS
  Administered 2018-10-05: 3 [IU] via SUBCUTANEOUS
  Administered 2018-10-07: 1 [IU] via SUBCUTANEOUS
  Administered 2018-10-07: 2 [IU] via SUBCUTANEOUS

## 2018-09-24 MED ORDER — LIDOCAINE 5 % EX PTCH
1.0000 | MEDICATED_PATCH | CUTANEOUS | Status: DC
  Administered 2018-09-24 – 2018-10-02 (×7): 1 via TRANSDERMAL
  Filled 2018-09-24 (×8): qty 1

## 2018-09-24 MED ORDER — LOPERAMIDE HCL 2 MG PO CAPS
ORAL_CAPSULE | ORAL | Status: AC
  Administered 2018-09-24: 2 mg via ORAL
  Filled 2018-09-24: qty 1

## 2018-09-24 NOTE — Progress Notes (Signed)
For 6/17 0700 to 1900, please page [phone].  Pager [phone] is not working. Brad Aydon Swamy, MD, MS FAMILY MEDICINE RESIDENT - PGY2 09/24/2018 2:15 PM 

## 2018-09-24 NOTE — Progress Notes (Signed)
Renal Navigator notified OP HD clinic/[facility] of patient's admission and negative COVID 19 rapid test result in order to provide continuity of care and safety.  Daleen Steinhaus [place], LCSW Renal Navigator [phone] 
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
## 2018-09-24 NOTE — Significant Event (Signed)
For 6/17 at 1900 to 6/18 at 0700, please page [phone].  Pager [phone] is not working.  [place] Constable, D.O.  PGY-1 Family Medicine  09/24/2018 7:37 PM

## 2018-09-24 NOTE — Progress Notes (Signed)
Orthopedic Tech Progress Note Patient Details:  Eddie Watson [birth year] [id number]  Ortho Devices Type of Ortho Device: Ulna gutter splint Ortho Device/Splint Location: LRE Ortho Device/Splint Interventions: Adjustment, Application, Ordered   Post Interventions Patient Tolerated: Well Instructions Provided: Care of device, Adjustment of device   Janit Pagan 09/24/2018, 3:46 PM

## 2018-09-24 NOTE — Progress Notes (Signed)
Right lower extremity venous duplex completed. Preliminary results in Chart review CV Proc. [place] Demauri Advincula,RVS 09/24/2018, 10:26 AM

## 2018-09-25 DIAGNOSIS — R7989 Other specified abnormal findings of blood chemistry: Secondary | ICD-10-CM

## 2018-09-25 DIAGNOSIS — J90 Pleural effusion, not elsewhere classified: Secondary | ICD-10-CM

## 2018-09-25 DIAGNOSIS — S6991XA Unspecified injury of right wrist, hand and finger(s), initial encounter: Secondary | ICD-10-CM

## 2018-09-25 LAB — RENAL FUNCTION PANEL
Albumin: 2.1 g/dL — ABNORMAL LOW (ref 3.5–5.0)
Anion gap: 8 (ref 5–15)
BUN: 13 mg/dL (ref 8–23)
CO2: 27 mmol/L (ref 22–32)
Calcium: 7.1 mg/dL — ABNORMAL LOW (ref 8.9–10.3)
Chloride: 100 mmol/L (ref 98–111)
Creatinine, Ser: 3.83 mg/dL — ABNORMAL HIGH (ref 0.61–1.24)
GFR calc Af Amer: 18 mL/min — ABNORMAL LOW (ref >60)
GFR calc non Af Amer: 16 mL/min — ABNORMAL LOW (ref >60)
Glucose, Bld: 114 mg/dL — ABNORMAL HIGH (ref 70–99)
Phosphorus: 2.1 mg/dL — ABNORMAL LOW (ref 2.5–4.6)
Potassium: 3.6 mmol/L (ref 3.5–5.1)
Sodium: 135 mmol/L (ref 135–145)

## 2018-09-25 LAB — GLUCOSE, CAPILLARY
Glucose-Capillary: 75 mg/dL (ref 70–99)
Glucose-Capillary: 176 mg/dL — ABNORMAL HIGH (ref 70–99)
Glucose-Capillary: 239 mg/dL — ABNORMAL HIGH (ref 70–99)

## 2018-09-25 MED ORDER — LIDOCAINE 5 % EX PTCH: 1.0000 | MEDICATED_PATCH | CUTANEOUS | Status: DC

## 2018-09-25 MED ORDER — HEPARIN SODIUM (PORCINE) 1000 UNIT/ML IJ SOLN
INTRAMUSCULAR | Status: AC
  Administered 2018-09-25: 4200 [IU] via INTRAVENOUS
  Filled 2018-09-25: qty 4

## 2018-09-25 MED ORDER — LOPERAMIDE HCL 2 MG PO CAPS
ORAL_CAPSULE | ORAL | Status: AC
  Administered 2018-09-25: 2 mg
  Filled 2018-09-25: qty 1

## 2018-09-25 MED ORDER — INSULIN ASPART 100 UNIT/ML ~~LOC~~ SOLN
1.0000 [IU] | Freq: Once | SUBCUTANEOUS | Status: AC
  Administered 2018-09-25: 1 [IU] via SUBCUTANEOUS

## 2018-09-25 MED ORDER — LABETALOL HCL 200 MG PO TABS
200.0000 mg | ORAL_TABLET | Freq: Two times a day (BID) | ORAL | Status: DC
  Administered 2018-09-25 – 2018-10-07 (×25): 200 mg via ORAL
  Filled 2018-09-25 (×25): qty 1

## 2018-09-25 MED ORDER — GATIFLOXACIN 0.5 % OP SOLN
1.0000 [drp] | Freq: Four times a day (QID) | OPHTHALMIC | Status: DC
  Administered 2018-09-25 – 2018-10-07 (×46): 1 [drp] via OPHTHALMIC
  Filled 2018-09-25: qty 2.5

## 2018-09-25 NOTE — Consult Note (Signed)
CC: History of corneal ulcer OS, note by primary team of pain with eye movements and pupillary reaction difference  HPI: Pt notes he has been treated for K ulcer at [facility]. He states his OS "still isn't better". He refers to his vision still not being as clear OS as in the OD. He denies any significant pain or foreign body sensation in the OS. Denies sudden visual change. Denies significant photophobia, photopsias, or new floaters. Patient is not sure why he had a K ulcer, has no CTL history and does not remember scratching his eye.  In review of exams from [facility] an inferior K scar is noted OS with most recent visit 09/15/18. On visits going back to early June there was significantly elevated IOP of 40 OS around 6/1-6/2, though on more recent visits including 09/15/18 the IOP OS was in the mid-teens. Notes indicate that the epithelial defect OS closed around 6/1-09/09/18. Pt currently noted to be on Pred Acetate BID OS, Latanoprost qhs OS, Dorzolamide BID OS, Timolol BID OS, Brimonidine BID OS, emycin qhs OS. Primary team has noted during last admission at [facility] fungal cultures and HSV culture was negative.  ROS: as in HPI  Patient Active Problem List   Diagnosis Date Noted  . Noncompliance   . Volume overload 09/23/2018  . Elevated serum creatinine   . Poor social situation   . Bacteremia due to Klebsiella pneumoniae 05/15/2018  . Sepsis ([place]) 05/14/2018  . Seizure ([place]) 05/14/2018  . End stage renal disease ([place]) 05/14/2018  . Chest pain 09/23/2013  . Diabetes ([place]) 09/23/2013  . Chronic diarrhea 09/23/2013  . HTN (hypertension) 09/23/2013  . Chest pain at rest 09/23/2013  . Anemia 09/23/2013   No current facility-administered medications on file prior to encounter.    Current Outpatient Medications on File Prior to Encounter  Medication Sig Dispense Refill  . amLODipine (NORVASC) 10 MG tablet Take 10 mg by mouth daily.    . brimonidine (ALPHAGAN) 0.2 % ophthalmic solution  Place 1 drop into the left eye 2 (two) times a day.    . cloNIDine (CATAPRES) 0.1 MG tablet Take 0.1 mg by mouth as needed (Blood pressure over 180).    . dorzolamide (TRUSOPT) 2 % ophthalmic solution Place 1 drop into the left eye 2 (two) times a day.    . erythromycin ophthalmic ointment Place 1 application into the left eye at bedtime.    . hydrALAZINE (APRESOLINE) 10 MG tablet Take 10 mg by mouth every 4 (four) hours as needed (BLOOD PRESSURE GREATER THAN 180).    . labetalol (NORMODYNE) 200 MG tablet Take 200 mg by mouth 2 (two) times daily.    Marland Kitchen latanoprost (XALATAN) 0.005 % ophthalmic solution Place 1 drop into the left eye at bedtime.    Marland Kitchen loperamide (IMODIUM) 2 MG capsule Take 2 mg by mouth as needed for diarrhea or loose stools.    . pantoprazole (PROTONIX) 40 MG tablet Take 40 mg by mouth 2 (two) times daily.    . prednisoLONE acetate (PRED FORTE) 1 % ophthalmic suspension Place 1 drop into the left eye 2 (two) times a day.    . timolol (TIMOPTIC) 0.5 % ophthalmic solution Place 1 drop into the left eye 2 (two) times a day.     Allergies  Allergen Reactions  . Penicillins Anaphylaxis    Has patient had a PCN reaction causing immediate rash, facial/tongue/throat swelling, SOB or lightheadedness with hypotension: Yes Has patient had a PCN reaction  causing severe rash involving mucus membranes or skin necrosis: Yes Has patient had a PCN reaction that required hospitalization Yes Has patient had a PCN reaction occurring within the last 10 years:unknown If all of the above answers are "NO", then may proceed with Cephalosporin use.   . Tomato     Face swells up like a basketball     EXAM  VAsc near OD (with +2.5D lens) 20/25 OS (without add lens) 20/50 pinhole no improvement  Pupils: OD round, reactive, no APD  OS round, sluggishly reactive, no APD  T(pen)  OD 16 mm Hg  OS 14 mm Hg  EOM: full OU, no pain elicited with eye movements during exam CVF: full to [facility] OU  Exam  with Indirect ophthalmoscope and 20D lens  Ext/Lids: no edema or erythema OU Conj/sclera: White/quiet OU Cornea: OD Clear OS inferior stromal scar with small ~0.55m fluorescein staining and filament, no obvious dendritiform pattern Iris: flat and round OU AC: deep and quiet OU, no hypopyon OU  Dilated with 2.5% phenyl and tropicamide @ 12:25PM  DFE with 20D/Indirect:  Vit: clear OU Disc: sharp and pink OU, 0.5 c/d OD, 0.6 OS no disc heme OU Mac: flat and dry OU Vessels: attenuated, normal distribution, perfused OU Periphery: flat and attached 360 OU  Some limited views posterior OS due to K scar   IMP/PLAN 1. Corneal Scar/Recent corneal ulcer OS - No significant injection, photophobia, or pain OS. Pt notes [facility] There was some question of possible HSV, which I discussed with the primary team can involve both epithelial keratitis (with dendrites and active HSV replication) vs stromal keratitis (believed to be more of an immune reaction to HSV antigen and treated more with topical steroids). However, I would continue current treatment and add a broad spectrum antibiotic gtt OS such as Moxifloxacin QID OS to cover for possible bacterial keratitis as there is a small epithelial defect present over the corneal infiltrate. At least until the patient can be re-evaluated outpatient. - cont Pred Acetate BID OS, emycin qhs OS - 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 DM - no diabetic retinopathy noted on 20D dilated exam  SLonia SkinnerOphthalmology [facility]Kind[facility]

## 2018-09-25 NOTE — Progress Notes (Addendum)
FPTS Interim Progress Note  Spoke to Dr. Eulas Post, ophthalmologist on call, regarding patient's h/o left eye corneal ulcer. Dr. Eulas Post was able to review chart, both hospital admission as well as previous [facility] documents. Per Dr. Eulas Post this is an immune response and treatment is long term steroids. If eye exam is stable without change patient can follow up as outpatient (recommends within a few days of discharge date). If there is any change in exam (I.e. new redness, photophobia, EOM pain, etc), will need more urgent exam as inpatient.   Appreciate recommendations from Dr. Eulas Post  Addendum: Have discussed new EOM pain and delayed pupillary response. Dr. Eulas Post will see patient today.   Caroline More, DO 09/25/2018, 9:01 AM PGY-2, [place] Service pager [phone]
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
## 2018-09-25 NOTE — NC FL2 (Signed)
[place] MEDICAID FL2 LEVEL OF CARE SCREENING TOOL     IDENTIFICATION  Patient Name: Eddie Watson: 07-13-1955 Sex: male Admission Date (Current Location): 09/23/2018  [place] and [place] Number:  Kathleen Argue [id number] [place] and Address:  [facility], [place] [address] [place], [place] [address]      Provider Number: [id number]  Attending Physician Name and Address:  Kinnie Feil, MD  Relative Name and Phone Number:       Current Level of Care: Hospital Recommended Level of Care: [place] Prior Approval Number:    Date Approved/Denied:   PASRR Number: [id number]  Discharge Plan: SNF(Long-term care bed requested)    Current Diagnoses: Patient Active Problem List   Diagnosis Date Noted  . Noncompliance   . Volume overload 09/23/2018  . Elevated serum creatinine   . Poor social situation   . Bacteremia due to Klebsiella pneumoniae 05/15/2018  . Sepsis ([place]) 05/14/2018  . Seizure ([place]) 05/14/2018  . End stage renal disease ([place]) 05/14/2018  . Chest pain 09/23/2013  . Diabetes ([place]) 09/23/2013  . Chronic diarrhea 09/23/2013  . HTN (hypertension) 09/23/2013  . Chest pain at rest 09/23/2013  . Anemia 09/23/2013    Orientation RESPIRATION BLADDER Height & Weight     Self, Time, Situation, Place  Normal Continent Weight: 174 lb 2.6 oz (79 kg) Height:     BEHAVIORAL SYMPTOMS/MOOD NEUROLOGICAL BOWEL NUTRITION STATUS    Convulsions/Seizures(History seizures) Continent Diet(Renal diet)  AMBULATORY STATUS COMMUNICATION OF NEEDS Skin   Limited Assist(Lower body mod assist) Verbally Normal                       Personal Care Assistance Level of Assistance  Bathing, Feeding, Dressing Bathing Assistance: Limited assistance(Lower body mod assist) Feeding assistance: Independent(assistance with set-up) Dressing Assistance: Limited assistance(Lower body mod assist)     Functional Limitations Info  Sight(Left eye  corneal ulcer) Sight Info: Impaired(Left eye corneal ulcer)        SPECIAL CARE FACTORS FREQUENCY  PT (By licensed PT), OT (By licensed OT)     PT Frequency: PT evaluation 6/17 OT Frequency: OT evaluation 6/17            Contractures      Additional Factors Info  Code Status, Allergies Code Status Info: Full Allergies Info: Penicillins           Current Medications (09/25/2018):  This is the current hospital active medication list Current Facility-Administered Medications  Medication Dose Route Frequency Provider Last Rate Last Dose  . loperamide (IMODIUM) 2 MG capsule           . acetaminophen (TYLENOL) tablet 650 mg  650 mg Oral Q6H PRN Mullis, Kiersten P, DO   650 mg at 09/25/18 0086   Or  . acetaminophen (TYLENOL) suppository 650 mg  650 mg Rectal Q6H PRN Mullis, Kiersten P, DO      . amLODipine (NORVASC) tablet 10 mg  10 mg Oral Daily Mullis, Kiersten P, DO   10 mg at 09/24/18 0023  . brimonidine (ALPHAGAN) 0.2 % ophthalmic solution 1 drop  1 drop Left Eye BID Mullis, Kiersten P, DO   1 drop at 09/25/18 1006  . calcitRIOL (ROCALTROL) capsule 0.5 mcg  0.5 mcg Oral Q M,W,F-HD Lynnda Child, PA-C   0.5 mcg at 09/24/18 1309  . calcium carbonate (OS-CAL - dosed in mg of elemental calcium) tablet 500 mg of elemental calcium  1 tablet Oral QHS  Lynnda Child, PA-C   500 mg of elemental calcium at 09/24/18 2318  . Chlorhexidine Gluconate Cloth 2 % PADS 6 each  6 each Topical Q0600 Lynnda Child, PA-C   6 each at 09/25/18 0524  . dorzolamide (TRUSOPT) 2 % ophthalmic solution 1 drop  1 drop Left Eye BID Mullis, Kiersten P, DO   1 drop at 09/25/18 1005  . erythromycin ophthalmic ointment 1 application  1 application Left Eye QHS Mullis, Kiersten P, DO   1 application at 48/18/56 2353  . heparin injection 4,200 Units  4.2 mL Intravenous Once Elmarie Shiley, MD      . heparin injection 5,000 Units  5,000 Units Subcutaneous BID Mullis, Kiersten P, DO   5,000 Units  at 09/24/18 1027  . insulin aspart (novoLOG) injection 0-9 Units  0-9 Units Subcutaneous TID WC Mullis, Kiersten P, DO   1 Units at 09/24/18 1711  . latanoprost (XALATAN) 0.005 % ophthalmic solution 1 drop  1 drop Left Eye QHS Mullis, Kiersten P, DO   1 drop at 09/24/18 2325  . lidocaine (LIDODERM) 5 % 1 patch  1 patch Transdermal Q24H Mullis, Kiersten P, DO   1 patch at 09/24/18 1154  . loperamide (IMODIUM) capsule 2 mg  2 mg Oral PRN Mullis, Kiersten P, DO   2 mg at 09/25/18 1230  . pantoprazole (PROTONIX) EC tablet 40 mg  40 mg Oral BID Mullis, Kiersten P, DO   40 mg at 09/24/18 2318  . prednisoLONE acetate (PRED FORTE) 1 % ophthalmic suspension 1 drop  1 drop Left Eye BID Mullis, Kiersten P, DO   1 drop at 09/25/18 1004  . timolol (TIMOPTIC) 0.5 % ophthalmic solution 1 drop  1 drop Left Eye BID Mullis, Kiersten P, DO   1 drop at 09/25/18 1003     Discharge Medications: Please see discharge summary for a list of discharge medications.  Relevant Imaging Results:  Relevant Lab Results:   Additional Information ss#[id number];  Dialysis patient - [facility] = 12:45 pm chair time  Sharlet Salina Mila Homer, LCSW

## 2018-09-25 NOTE — Plan of Care (Signed)
  Problem: Education: Goal: Knowledge of General Education information will improve Description Including pain rating scale, medication(s)/side effects and non-pharmacologic comfort measures Outcome: Progressing   

## 2018-09-25 NOTE — Progress Notes (Signed)
Renal Navigator notes that patient has not been consistent with treatment at his OP HD clinic/[facility] within the last few months. Renal Navigator contacted clinic and has confirmed that he may return there for treatment and now has a TTS schedule with a seat time of 12:45pm. Renal Navigator notified Renal PA/G. Ejigiri and CSW/V. Crawford.   Alphonzo Cruise, [place] Renal Navigator
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
## 2018-09-25 NOTE — Progress Notes (Signed)
Hemodialysis- Patient had mild cramping in bilateral feet during last 5 minutes of treatment. Treatment ended early per patient request. Discussed importance of adherence with hd prescription. Birdie Hopes notified. Report called to primary RN. Able to UF 2L.
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
## 2018-09-25 NOTE — Progress Notes (Signed)
Patient refused AM labs at this time, On call MD notified. Will continue to monitor.

## 2018-09-25 NOTE — Progress Notes (Signed)
Hemodialysis- Patient states he will only stay 2 hours today. Discussed importance of completing full treatments with patient. Notified PA.

## 2018-09-25 NOTE — Significant Event (Signed)
For 6/18 1900 to 6/19 0700, please page [phone].  Pager [phone] is not working.  [place] Constable, D.O.  PGY-1 Family Medicine  09/25/2018 8:10 PM
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
## 2018-09-25 NOTE — Significant Event (Signed)
For 6/18 at 0700 to 1900, please page [phone].  Pager [phone] is not working.  Brad Thompson, MD, MS FAMILY MEDICINE RESIDENT - PGY2 09/25/2018 2:58 PM 

## 2018-09-25 NOTE — Progress Notes (Addendum)
FPTS Interim Progress Note  Spoke to infection prevention Tamsen Meek) given patient's history of chronic c-diff colonization. She was able to review chart and recommended that if patient is asymptomatic then no precautions needed to be placed.   Caroline More, DO 09/25/2018, 9:11 AM PGY-2, [place] Medicine Service pager [phone]

## 2018-09-26 LAB — RENAL FUNCTION PANEL
Albumin: 2.2 g/dL — ABNORMAL LOW (ref 3.5–5.0)
Anion gap: 9 (ref 5–15)
BUN: 21 mg/dL (ref 8–23)
CO2: 26 mmol/L (ref 22–32)
Calcium: 7.3 mg/dL — ABNORMAL LOW (ref 8.9–10.3)
Chloride: 101 mmol/L (ref 98–111)
Creatinine, Ser: 4.77 mg/dL — ABNORMAL HIGH (ref 0.61–1.24)
GFR calc Af Amer: 14 mL/min — ABNORMAL LOW (ref >60)
GFR calc non Af Amer: 12 mL/min — ABNORMAL LOW (ref >60)
Glucose, Bld: 84 mg/dL (ref 70–99)
Phosphorus: 2.7 mg/dL (ref 2.5–4.6)
Potassium: 3.7 mmol/L (ref 3.5–5.1)
Sodium: 136 mmol/L (ref 135–145)

## 2018-09-26 LAB — HCV RT-PCR, QUANT (NON-GRAPH)
HCV log10: 6.253 log10 IU/mL
Hepatitis C Quantitation: 1790000 IU/mL

## 2018-09-26 LAB — GLUCOSE, CAPILLARY
Glucose-Capillary: 72 mg/dL (ref 70–99)
Glucose-Capillary: 131 mg/dL — ABNORMAL HIGH (ref 70–99)
Glucose-Capillary: 183 mg/dL — ABNORMAL HIGH (ref 70–99)
Glucose-Capillary: 155 mg/dL — ABNORMAL HIGH (ref 70–99)

## 2018-09-26 LAB — HCV AB W REFLEX TO QUANT PCR: HCV Ab: >11 s/co ratio — ABNORMAL HIGH (ref 0.0–0.9)

## 2018-09-26 MED ORDER — CALCITRIOL 0.5 MCG PO CAPS
0.5000 ug | ORAL_CAPSULE | ORAL | Status: DC
  Administered 2018-09-27 – 2018-10-04 (×5): 0.5 ug via ORAL
  Filled 2018-09-26 (×2): qty 1

## 2018-09-26 MED ORDER — MUPIROCIN CALCIUM 2 % EX CREA
TOPICAL_CREAM | Freq: Every day | CUTANEOUS | Status: DC
  Administered 2018-09-26 – 2018-10-07 (×7): via TOPICAL
  Filled 2018-09-26 (×2): qty 15

## 2018-09-26 MED ORDER — SULFAMETHOXAZOLE-TRIMETHOPRIM 800-160 MG PO TABS
1.0000 | ORAL_TABLET | Freq: Every day | ORAL | Status: AC
  Administered 2018-09-26: 1 via ORAL
  Filled 2018-09-26: qty 1

## 2018-09-26 MED ORDER — SULFAMETHOXAZOLE-TRIMETHOPRIM 400-80 MG PO TABS
1.0000 | ORAL_TABLET | ORAL | Status: DC
  Administered 2018-09-27: 1 via ORAL
  Filled 2018-09-26: qty 1

## 2018-09-26 MED ORDER — RENA-VITE PO TABS
1.0000 | ORAL_TABLET | Freq: Every day | ORAL | Status: DC
  Administered 2018-09-26 – 2018-10-07 (×12): 1 via ORAL
  Filled 2018-09-26 (×12): qty 1

## 2018-09-26 MED ORDER — PRO-STAT SUGAR FREE PO LIQD
30.0000 mL | Freq: Two times a day (BID) | ORAL | Status: DC
  Administered 2018-09-29 – 2018-09-30 (×2): 30 mL via ORAL
  Filled 2018-09-26 (×18): qty 30

## 2018-09-26 MED ORDER — CHLORHEXIDINE GLUCONATE CLOTH 2 % EX PADS
6.0000 | MEDICATED_PAD | Freq: Every day | CUTANEOUS | Status: DC
  Administered 2018-09-27 – 2018-09-29 (×3): 6 via TOPICAL

## 2018-09-26 NOTE — Progress Notes (Signed)
Orthopedic Tech Progress Note Patient Details:  Eddie Watson 03/07/1956 [id number]  Ortho Devices Type of Ortho Device: Ulna gutter splint Ortho Device/Splint Location: Removed unna splint cleaned hand and arm. Doctor is ordering antibiotic ointment for wound. Ortho Device/Splint Interventions: Removal   Post Interventions Patient Tolerated: Well Instructions Provided: Care of device   [name] 09/26/2018, 1:39 PM
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
## 2018-09-26 NOTE — Plan of Care (Signed)
  Problem: Education: Goal: Knowledge of General Education information will improve Description Including pain rating scale, medication(s)/side effects and non-pharmacologic comfort measures Outcome: Progressing   
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
## 2018-09-26 NOTE — Progress Notes (Signed)
Patient is wanting to change his diet from renal to regular. MD notified and made aware. Education provided to patient about importance of maintaining renal diet. Patient still not receptive.

## 2018-09-26 NOTE — Progress Notes (Signed)
Orthopedic Tech Progress Note Patient Details:  Mahmoud Rabago 1955-10-26 [id number]  Ortho Devices Type of Ortho Device: Ace wrap, Ulna gutter splint Ortho Device/Splint Location: Removed unna splint cleaned hand and arm. Doctor is ordering antibiotic ointment for wound. Ortho Device/Splint Interventions: Application   Post Interventions Patient Tolerated: Well Instructions Provided: Care of device   [name] 09/26/2018, 4:38 PM
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
## 2018-09-26 NOTE — Progress Notes (Signed)
Dressing changed per order. Patient continues to complain about diet. MD notified and made aware.

## 2018-09-26 NOTE — Progress Notes (Signed)
Patient has no contact information listed for family. Patient stated that he did not want me to call anyone.

## 2018-09-26 NOTE — Consult Note (Signed)
[place] Nurse wound consult note Reason for Consult: Consult requested for left 4th finger.  Reviewed photos and progress notes in the EMR.  Wound type: full thickness laceration to right posterior 4th finger Periwound: Generalized edema to digit.  X-ray does not indicate a fracture.  Dressing procedure/placement/frequency: Bactroban has already been ordered by a physician to promote moist healing and provide antimicrobial benefits. Agree with this plan of care, and foam dressing to protect from further injury. Please re-consult if further assistance is needed.  Thank-you,  Julien Girt MSN, [place], [place], [place], [place]

## 2018-09-26 NOTE — Significant Event (Signed)
For 6/19 0700 to 1900, please page [phone]  Pager [phone] is not working.  Brad Kyrene Longan, MD, MS FAMILY MEDICINE RESIDENT - PGY2 09/26/2018 7:42 AM 

## 2018-09-26 NOTE — Progress Notes (Addendum)
FPTS Interim Progress Note  S:Went to examine patients right hand after removal of splint. Swelling appears imrpoved from admission. Some edema to finger remain. He notes some pain still along his fingers but primarily at his right 4th finger. ON inspection patient does have an open wound on anterior aspect of distal 4th finger. Patient does have an open wound on right 4 finger. Patient notes that he does like the splint; it makes him feel more stable.  O: BP 127/63 (BP Location: Left Arm)   Pulse 64   Temp 98.3 F (36.8 C) (Oral)   Resp 12   Ht 5\' 10"  (1.778 m)   Wt 77.2 kg   SpO2 98%   BMI 24.42 kg/m    General: pleasant older male, NAD  Skin: warm, dry, small open wound on right distal 4th finger, difficult to tell if erythematous surrounding wound, finger surround wound appears more swollen than compared fingers with possible increased warmth, no drainage or bleeding appreciated, nail bed appears to be more raised with crusting underneath  Extremities: warm and well perfused, right hand still mildly more swollen when compared with left, however improved from days prior, <2 sec cap refill            A/P: Swelling does appear improved overall. Right 4th distal finger with open wound with concern for underlying infection given surrounding swelling and rise of nail bed with underlying crusting not present on surrounding nails. Allergy to PCN with anaphylaxis.  - daily dressing change - start Bactrim DS x 1, SS x 4 days - consult wound care - reapplu ulnar gutter splint - mupirocin ointment with dressing changes  Danna Hefty, DO 09/26/2018, 1:35 PM PGY-1, [place]

## 2018-09-27 DIAGNOSIS — S61314A Laceration without foreign body of right ring finger with damage to nail, initial encounter: Secondary | ICD-10-CM

## 2018-09-27 LAB — RENAL FUNCTION PANEL
Albumin: 2.1 g/dL — ABNORMAL LOW (ref 3.5–5.0)
Anion gap: 10 (ref 5–15)
BUN: 32 mg/dL — ABNORMAL HIGH (ref 8–23)
CO2: 24 mmol/L (ref 22–32)
Calcium: 7.2 mg/dL — ABNORMAL LOW (ref 8.9–10.3)
Chloride: 101 mmol/L (ref 98–111)
Creatinine, Ser: 6.11 mg/dL — ABNORMAL HIGH (ref 0.61–1.24)
GFR calc Af Amer: 10 mL/min — ABNORMAL LOW (ref >60)
GFR calc non Af Amer: 9 mL/min — ABNORMAL LOW (ref >60)
Glucose, Bld: 214 mg/dL — ABNORMAL HIGH (ref 70–99)
Phosphorus: 3.1 mg/dL (ref 2.5–4.6)
Potassium: 3.8 mmol/L (ref 3.5–5.1)
Sodium: 135 mmol/L (ref 135–145)

## 2018-09-27 LAB — IRON AND TIBC
Iron: 36 ug/dL — ABNORMAL LOW (ref 45–182)
Saturation Ratios: 16 % — ABNORMAL LOW (ref 17.9–39.5)
TIBC: 225 ug/dL — ABNORMAL LOW (ref 250–450)
UIBC: 189 ug/dL

## 2018-09-27 LAB — CBC
HCT: 30.1 % — ABNORMAL LOW (ref 39.0–52.0)
Hemoglobin: 8.8 g/dL — ABNORMAL LOW (ref 13.0–17.0)
MCH: 25 pg — ABNORMAL LOW (ref 26.0–34.0)
MCHC: 29.2 g/dL — ABNORMAL LOW (ref 30.0–36.0)
MCV: 85.5 fL (ref 80.0–100.0)
Platelets: 238 10*3/uL (ref 150–400)
RBC: 3.52 MIL/uL — ABNORMAL LOW (ref 4.22–5.81)
RDW: 19.5 % — ABNORMAL HIGH (ref 11.5–15.5)
WBC: 6.9 10*3/uL (ref 4.0–10.5)
nRBC: 0 % (ref 0.0–0.2)

## 2018-09-27 LAB — GLUCOSE, CAPILLARY
Glucose-Capillary: 84 mg/dL (ref 70–99)
Glucose-Capillary: 128 mg/dL — ABNORMAL HIGH (ref 70–99)
Glucose-Capillary: 89 mg/dL (ref 70–99)
Glucose-Capillary: 153 mg/dL — ABNORMAL HIGH (ref 70–99)

## 2018-09-27 MED ORDER — SODIUM CHLORIDE 0.9 % IV SOLN: 100.0000 mL | INTRAVENOUS | Status: DC | PRN

## 2018-09-27 MED ORDER — CALCITRIOL 0.5 MCG PO CAPS
ORAL_CAPSULE | ORAL | Status: AC
  Administered 2018-09-27: 0.5 ug via ORAL
  Filled 2018-09-27: qty 1

## 2018-09-27 MED ORDER — LIDOCAINE HCL (PF) 1 % IJ SOLN: 5.0000 mL | INTRAMUSCULAR | Status: DC | PRN

## 2018-09-27 MED ORDER — HEPARIN SODIUM (PORCINE) 1000 UNIT/ML DIALYSIS: 1000.0000 [IU] | INTRAMUSCULAR | Status: DC | PRN

## 2018-09-27 MED ORDER — PENTAFLUOROPROP-TETRAFLUOROETH EX AERO: 1.0000 "application " | INHALATION_SPRAY | CUTANEOUS | Status: DC | PRN

## 2018-09-27 MED ORDER — HEPARIN SODIUM (PORCINE) 1000 UNIT/ML IJ SOLN
INTRAMUSCULAR | Status: AC
  Filled 2018-09-27: qty 1

## 2018-09-27 MED ORDER — ALTEPLASE 2 MG IJ SOLR: 2.0000 mg | Freq: Once | INTRAMUSCULAR | Status: DC | PRN

## 2018-09-27 MED ORDER — LIDOCAINE-PRILOCAINE 2.5-2.5 % EX CREA: 1.0000 "application " | TOPICAL_CREAM | CUTANEOUS | Status: DC | PRN
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
## 2018-09-28 ENCOUNTER — Inpatient Hospital Stay (HOSPITAL_COMMUNITY): Payer: Medicaid Other

## 2018-09-28 LAB — GLUCOSE, CAPILLARY
Glucose-Capillary: 87 mg/dL (ref 70–99)
Glucose-Capillary: 200 mg/dL — ABNORMAL HIGH (ref 70–99)
Glucose-Capillary: 89 mg/dL (ref 70–99)

## 2018-09-28 LAB — PTH, INTACT AND CALCIUM
Calcium, Total (PTH): 7.4 mg/dL — ABNORMAL LOW (ref 8.6–10.2)
PTH: 368 pg/mL — ABNORMAL HIGH (ref 15–65)

## 2018-09-28 MED ORDER — SODIUM CHLORIDE 0.9 % IV SOLN: 62.5000 mg | INTRAVENOUS | Status: DC

## 2018-09-28 MED ORDER — DOXYCYCLINE HYCLATE 100 MG PO TABS
100.0000 mg | ORAL_TABLET | Freq: Two times a day (BID) | ORAL | Status: AC
  Administered 2018-09-28 – 2018-09-30 (×6): 100 mg via ORAL
  Filled 2018-09-28 (×6): qty 1
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
## 2018-09-28 NOTE — Plan of Care (Signed)
  Problem: Education: Goal: Knowledge of General Education information will improve Description: Including pain rating scale, medication(s)/side effects and non-pharmacologic comfort measures Outcome: Progressing   Problem: Health Behavior/Discharge Planning: Goal: Ability to manage health-related needs will improve Outcome: Progressing   Problem: Clinical Measurements: Goal: Ability to maintain clinical measurements within normal limits will improve Outcome: Progressing Goal: Will remain free from infection Outcome: Progressing   Problem: Elimination: Goal: Will not experience complications related to urinary retention Outcome: Progressing   Problem: Safety: Goal: Ability to remain free from injury will improve Outcome: Progressing

## 2018-09-28 NOTE — Progress Notes (Signed)
Patient states no family to call to give updates.

## 2018-09-29 LAB — GLUCOSE, CAPILLARY
Glucose-Capillary: 137 mg/dL — ABNORMAL HIGH (ref 70–99)
Glucose-Capillary: 130 mg/dL — ABNORMAL HIGH (ref 70–99)
Glucose-Capillary: 151 mg/dL — ABNORMAL HIGH (ref 70–99)
Glucose-Capillary: 144 mg/dL — ABNORMAL HIGH (ref 70–99)

## 2018-09-29 MED ORDER — SODIUM CHLORIDE 0.9 % IV SOLN
125.0000 mg | INTRAVENOUS | Status: DC
  Administered 2018-09-30 – 2018-10-04 (×3): 125 mg via INTRAVENOUS
  Filled 2018-09-29 (×6): qty 10

## 2018-09-29 MED ORDER — CHLORHEXIDINE GLUCONATE CLOTH 2 % EX PADS
6.0000 | MEDICATED_PAD | Freq: Every day | CUTANEOUS | Status: DC
  Administered 2018-09-30 – 2018-10-01 (×2): 6 via TOPICAL

## 2018-09-29 NOTE — Progress Notes (Signed)
Request to IR for therapeutic thoracentesis. Per IR protocol COVID testing is required for all aerosol generating procedures within 48 hours of planned procedure time. COVID testing ordered today however per RN patient is refusing. RN and patient are aware that thoracentesis cannot be performed without COVID testing.  Will follow up with patient tomorrow.  Please call with questions or concerns.  Candiss Norse, PA-C Pager# [phone]
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
## 2018-09-29 NOTE — Plan of Care (Signed)
  Problem: Education: Goal: Knowledge of General Education information will improve Description: Including pain rating scale, medication(s)/side effects and non-pharmacologic comfort measures Outcome: Progressing   Problem: Clinical Measurements: Goal: Will remain free from infection Outcome: Progressing   Problem: Elimination: Goal: Will not experience complications related to bowel motility Outcome: Progressing

## 2018-09-29 NOTE — Progress Notes (Signed)
Late Entry: Renal Navigator informed by CSW that patient has been accepted by [facility] in [place] for long term SNF placement. Renal Navigator submitted transfer request through [facility] to place patient at [facility] and was informed that this location is not accepting new patients due to remodeling clinic. Renal Navigator and CSW will continue working on plan for patient.  Alphonzo Cruise, [place] Renal Navigator [phone]
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
## 2018-09-29 NOTE — Progress Notes (Addendum)
Pt refused Covid testing, explained to pt that it is required for his thoracentesis tom, pt verbalized understanding but still declined. PA [employer] notified.   1500: MD talked to pt, pt now agreeable to do the swab tomorrow morning.

## 2018-09-29 NOTE — TOC Progression Note (Signed)
Transition of Care [facility]) - Progression Note    Patient Details  Name: Eddie Watson MRN: [id number] Date of Birth: [birth year]  Transition of Care [facility]) CM/SW [place], LCSW Phone Number: 09/29/2018, 1:08 PM  Clinical Narrative:    Per [facility], their [place] location is unable to accept patient, however their [place] location can. They requested patient's dialysis be set up at [facility] in [place] on MWF if possible. Renal Navigator notified of change in SNF location.    Expected Discharge Plan: (Unknown at this time) Barriers to Discharge: (Finding a facility in [facility] that will accept him LTC-Medicaid only)  Expected Discharge Plan and Services Expected Discharge Plan: (Unknown at this time)                                               Social Determinants of Health (SDOH) Interventions    Readmission Risk Interventions No flowsheet data found.

## 2018-09-29 NOTE — Progress Notes (Signed)
Renal Navigator contacted [facility] directly to see about seat availability, as Renal Navigator was initially informed that they were closed for remodeling, but transfer request was still put through by Admissions. Renal Navigator spoke with CM/D. Deshazor who states she has one open seat on a TTS second shift schedule. Renal Navigator spoke with CSW/N. Rayyan who is covering this patient today to ask that she contact the [facility] to see if they will transport patient to this shift.  Renal Navigator will continue to follow.  Alphonzo Cruise, [place] Renal Navigator [phone]

## 2018-09-30 LAB — RENAL FUNCTION PANEL
Albumin: 2.3 g/dL — ABNORMAL LOW (ref 3.5–5.0)
Anion gap: 10 (ref 5–15)
BUN: 33 mg/dL — ABNORMAL HIGH (ref 8–23)
CO2: 24 mmol/L (ref 22–32)
Calcium: 7.8 mg/dL — ABNORMAL LOW (ref 8.9–10.3)
Chloride: 104 mmol/L (ref 98–111)
Creatinine, Ser: 6.95 mg/dL — ABNORMAL HIGH (ref 0.61–1.24)
GFR calc Af Amer: 9 mL/min — ABNORMAL LOW (ref >60)
GFR calc non Af Amer: 8 mL/min — ABNORMAL LOW (ref >60)
Glucose, Bld: 112 mg/dL — ABNORMAL HIGH (ref 70–99)
Phosphorus: 3.2 mg/dL (ref 2.5–4.6)
Potassium: 4.2 mmol/L (ref 3.5–5.1)
Sodium: 138 mmol/L (ref 135–145)

## 2018-09-30 LAB — GLUCOSE, CAPILLARY
Glucose-Capillary: 132 mg/dL — ABNORMAL HIGH (ref 70–99)
Glucose-Capillary: 118 mg/dL — ABNORMAL HIGH (ref 70–99)
Glucose-Capillary: 132 mg/dL — ABNORMAL HIGH (ref 70–99)
Glucose-Capillary: 139 mg/dL — ABNORMAL HIGH (ref 70–99)

## 2018-09-30 LAB — CBC
HCT: 29.7 % — ABNORMAL LOW (ref 39.0–52.0)
Hemoglobin: 8.6 g/dL — ABNORMAL LOW (ref 13.0–17.0)
MCH: 25.1 pg — ABNORMAL LOW (ref 26.0–34.0)
MCHC: 29 g/dL — ABNORMAL LOW (ref 30.0–36.0)
MCV: 86.8 fL (ref 80.0–100.0)
Platelets: 241 10*3/uL (ref 150–400)
RBC: 3.42 MIL/uL — ABNORMAL LOW (ref 4.22–5.81)
RDW: 19.3 % — ABNORMAL HIGH (ref 11.5–15.5)
WBC: 5.7 10*3/uL (ref 4.0–10.5)
nRBC: 0 % (ref 0.0–0.2)

## 2018-09-30 LAB — SARS CORONAVIRUS 2 BY RT PCR (HOSPITAL ORDER, PERFORMED IN ~~LOC~~ HOSPITAL LAB): SARS Coronavirus 2: NEGATIVE

## 2018-09-30 MED ORDER — LIDOCAINE-PRILOCAINE 2.5-2.5 % EX CREA: 1.0000 "application " | TOPICAL_CREAM | CUTANEOUS | Status: DC | PRN

## 2018-09-30 MED ORDER — ALTEPLASE 2 MG IJ SOLR: 2.0000 mg | Freq: Once | INTRAMUSCULAR | Status: DC | PRN

## 2018-09-30 MED ORDER — PENTAFLUOROPROP-TETRAFLUOROETH EX AERO: 1.0000 "application " | INHALATION_SPRAY | CUTANEOUS | Status: DC | PRN

## 2018-09-30 MED ORDER — LIDOCAINE HCL (PF) 1 % IJ SOLN: 5.0000 mL | INTRAMUSCULAR | Status: DC | PRN

## 2018-09-30 MED ORDER — SODIUM CHLORIDE 0.9 % IV SOLN: 100.0000 mL | INTRAVENOUS | Status: DC | PRN

## 2018-09-30 MED ORDER — HEPARIN SODIUM (PORCINE) 1000 UNIT/ML DIALYSIS: 1000.0000 [IU] | INTRAMUSCULAR | Status: DC | PRN

## 2018-09-30 MED ORDER — HEPARIN SODIUM (PORCINE) 1000 UNIT/ML IJ SOLN
INTRAMUSCULAR | Status: AC
  Filled 2018-09-30: qty 1

## 2018-09-30 NOTE — Progress Notes (Signed)
Chart reviewed for LOS B Dani Wallner RN,MHA,BSN Advance Care Supervisor [phone] 
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
## 2018-09-30 NOTE — Progress Notes (Signed)
Renal Navigator discussed case with CSW/V. Crawford who states patient will be accepted at the [facility] in [place] and requests that his OP HD seat be transferred to the [facility] unit in [place]. Renal Navigator submitted referral to [facility] and will follow up with CSW and Nephrologist regarding acceptance and seat schedule.  Alphonzo Cruise, [place] Renal Navigator [phone]
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
## 2018-09-30 NOTE — Plan of Care (Signed)
  Problem: Clinical Measurements: Goal: Will remain free from infection Outcome: Progressing   Problem: Clinical Measurements: Goal: Respiratory complications will improve Outcome: Completed/Met

## 2018-10-01 ENCOUNTER — Encounter (HOSPITAL_COMMUNITY): Payer: Self-pay | Admitting: Student

## 2018-10-01 ENCOUNTER — Inpatient Hospital Stay (HOSPITAL_COMMUNITY): Payer: Medicaid Other

## 2018-10-01 HISTORY — PX: IR THORACENTESIS ASP PLEURAL SPACE W/IMG GUIDE: IMG5380

## 2018-10-01 LAB — HEPATITIS B SURFACE ANTIGEN: Hepatitis B Surface Ag: NEGATIVE

## 2018-10-01 LAB — HEPATITIS B SURFACE ANTIBODY, QUANTITATIVE: Hep B S AB Quant (Post): >1000 m[IU]/mL

## 2018-10-01 LAB — GLUCOSE, CAPILLARY
Glucose-Capillary: 108 mg/dL — ABNORMAL HIGH (ref 70–99)
Glucose-Capillary: 152 mg/dL — ABNORMAL HIGH (ref 70–99)
Glucose-Capillary: 150 mg/dL — ABNORMAL HIGH (ref 70–99)
Glucose-Capillary: 128 mg/dL — ABNORMAL HIGH (ref 70–99)

## 2018-10-01 LAB — HEPATITIS B CORE ANTIBODY, TOTAL: Hep B Core Total Ab: NEGATIVE

## 2018-10-01 MED ORDER — LIDOCAINE HCL 1 % IJ SOLN
INTRAMUSCULAR | Status: AC
  Filled 2018-10-01: qty 20

## 2018-10-01 MED ORDER — LIDOCAINE HCL (PF) 1 % IJ SOLN
INTRAMUSCULAR | Status: DC | PRN
  Administered 2018-10-01: 10 mL
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
## 2018-10-01 NOTE — Procedures (Signed)
PROCEDURE SUMMARY:  Successful image-guided right thoracentesis. Yielded 850 milliliters of hazy gold fluid. Procedure was stopped after 850 milliliters due to patient coughing/vomiting. Patient tolerated procedure well. No immediate complications. EBL = 5 mL.  Specimen was not sent for labs. CXR ordered.  Claris Pong Aaren [place]ah PA-C 10/01/2018 9:21 AM

## 2018-10-01 NOTE — Plan of Care (Signed)
  Problem: Clinical Measurements: °Goal: Ability to maintain clinical measurements within normal limits will improve °Outcome: Progressing °Goal: Will remain free from infection °Outcome: Progressing °  °Problem: Elimination: °Goal: Will not experience complications related to bowel motility °Outcome: Progressing °  °

## 2018-10-01 NOTE — Progress Notes (Signed)
Renal Navigator spoke with OP HD clinic/East manager regarding request for records sent to [facility]. Clinic Manager states she will have [place] send requested records, though most recent records are from April, to [facility] to help facilitate transfer request.   Alphonzo Cruise,  Renal Navigator [phone]

## 2018-10-01 NOTE — Progress Notes (Signed)
Renal Navigator contacted [facility]/Josephine to request an update on referral to [facility] in [place]. Renal Navigator was told that they did not receive H&P, but then Admissions Coordinator found it and that they did not receive treatment sheets and records from [facility] OP HD clinic. Renal Navigator resent treatment sheets from the hospital and called OP HD clinic/East and spoke with Social Worker/Chelsea to discuss. Since patient has not been active there in a few months, there is a question as to whether [place] has this information. Social Worker requests a return call in 1.5 hours after meeting. Renal Navigator agreed to call her back.  Alphonzo Cruise, [place] Renal Navigator [phone]
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
## 2018-10-02 LAB — CBC
HCT: 28.4 % — ABNORMAL LOW (ref 39.0–52.0)
Hemoglobin: 8.3 g/dL — ABNORMAL LOW (ref 13.0–17.0)
MCH: 25.1 pg — ABNORMAL LOW (ref 26.0–34.0)
MCHC: 29.2 g/dL — ABNORMAL LOW (ref 30.0–36.0)
MCV: 85.8 fL (ref 80.0–100.0)
Platelets: 217 10*3/uL (ref 150–400)
RBC: 3.31 MIL/uL — ABNORMAL LOW (ref 4.22–5.81)
RDW: 19.6 % — ABNORMAL HIGH (ref 11.5–15.5)
WBC: 6.3 10*3/uL (ref 4.0–10.5)
nRBC: 0 % (ref 0.0–0.2)

## 2018-10-02 LAB — RENAL FUNCTION PANEL
Albumin: 2.3 g/dL — ABNORMAL LOW (ref 3.5–5.0)
Anion gap: 10 (ref 5–15)
BUN: 43 mg/dL — ABNORMAL HIGH (ref 8–23)
CO2: 24 mmol/L (ref 22–32)
Calcium: 8 mg/dL — ABNORMAL LOW (ref 8.9–10.3)
Chloride: 104 mmol/L (ref 98–111)
Creatinine, Ser: 6.59 mg/dL — ABNORMAL HIGH (ref 0.61–1.24)
GFR calc Af Amer: 10 mL/min — ABNORMAL LOW (ref >60)
GFR calc non Af Amer: 8 mL/min — ABNORMAL LOW (ref >60)
Glucose, Bld: 115 mg/dL — ABNORMAL HIGH (ref 70–99)
Phosphorus: 4.8 mg/dL — ABNORMAL HIGH (ref 2.5–4.6)
Potassium: 4.8 mmol/L (ref 3.5–5.1)
Sodium: 138 mmol/L (ref 135–145)

## 2018-10-02 LAB — GLUCOSE, CAPILLARY
Glucose-Capillary: 90 mg/dL (ref 70–99)
Glucose-Capillary: 128 mg/dL — ABNORMAL HIGH (ref 70–99)
Glucose-Capillary: 161 mg/dL — ABNORMAL HIGH (ref 70–99)

## 2018-10-02 MED ORDER — HEPARIN SODIUM (PORCINE) 1000 UNIT/ML IJ SOLN
INTRAMUSCULAR | Status: AC
  Administered 2018-10-02: 4200 [IU] via INTRAVENOUS_CENTRAL
  Filled 2018-10-02: qty 5

## 2018-10-02 MED ORDER — SODIUM CHLORIDE 0.9 % IV SOLN: 100.0000 mL | INTRAVENOUS | Status: DC | PRN

## 2018-10-02 MED ORDER — ACETAMINOPHEN 325 MG PO TABS
ORAL_TABLET | ORAL | Status: AC
  Filled 2018-10-02: qty 2

## 2018-10-02 MED ORDER — PENTAFLUOROPROP-TETRAFLUOROETH EX AERO: 1.0000 "application " | INHALATION_SPRAY | CUTANEOUS | Status: DC | PRN

## 2018-10-02 MED ORDER — ALTEPLASE 2 MG IJ SOLR: 2.0000 mg | Freq: Once | INTRAMUSCULAR | Status: DC | PRN

## 2018-10-02 MED ORDER — HEPARIN SODIUM (PORCINE) 1000 UNIT/ML DIALYSIS
1000.0000 [IU] | INTRAMUSCULAR | Status: DC | PRN
  Administered 2018-10-02: 4200 [IU] via INTRAVENOUS_CENTRAL

## 2018-10-02 MED ORDER — CALCITRIOL 0.5 MCG PO CAPS
ORAL_CAPSULE | ORAL | Status: AC
  Filled 2018-10-02: qty 1

## 2018-10-02 MED ORDER — LIDOCAINE-PRILOCAINE 2.5-2.5 % EX CREA: 1.0000 "application " | TOPICAL_CREAM | CUTANEOUS | Status: DC | PRN

## 2018-10-02 NOTE — Procedures (Signed)
Seen on HD, no new c/o's.  Awaiting dialysis to match his SNF placement in [place].   I was present at this dialysis session, have reviewed the session itself and made  appropriate changes Kelly Splinter MD [place] pager [phone]   10/02/2018, 2:53 PM

## 2018-10-02 NOTE — Progress Notes (Signed)
Renal Navigator received call from [facility] stating that medical director at their facility has initially declined patient as a transfer from [facility] and asked that he be placed at a SNF in [place] for OP HD treatment in [place]. Renal Navigator explained that per CSW/V. Crawford, [facility] is the only SNF who has been accepting of patient and that he was initially going to be accepted at [facility] in [place] for OP HD treatment until the [facility] in [place] stated that they did not have transportation to take him to HD. The [facility] stated that they would have to accept him at their facility in [place], necessitating a transfer to [facility] in [place] for OP HD treatment. Joy/Davita Eden stated understanding and states she will speak to the medical director regarding this and follow up with Renal Navigator.  Alphonzo Cruise, [place] Renal Navigator [phone]

## 2018-10-02 NOTE — Plan of Care (Signed)
  Problem: Clinical Measurements: Goal: Ability to maintain clinical measurements within normal limits will improve Outcome: Completed/Met Goal: Will remain free from infection Outcome: Completed/Met Goal: Diagnostic test results will improve Outcome: Completed/Met Goal: Cardiovascular complication will be avoided Outcome: Completed/Met   Problem: Elimination: Goal: Will not experience complications related to bowel motility Outcome: Completed/Met Goal: Will not experience complications related to urinary retention Outcome: Completed/Met   Problem: Pain Managment: Goal: General experience of comfort will improve Outcome: Completed/Met   Problem: Safety: Goal: Ability to remain free from injury will improve Outcome: Completed/Met
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
## 2018-10-03 LAB — GLUCOSE, CAPILLARY
Glucose-Capillary: 128 mg/dL — ABNORMAL HIGH (ref 70–99)
Glucose-Capillary: 75 mg/dL (ref 70–99)
Glucose-Capillary: 128 mg/dL — ABNORMAL HIGH (ref 70–99)
Glucose-Capillary: 104 mg/dL — ABNORMAL HIGH (ref 70–99)

## 2018-10-03 MED ORDER — DARBEPOETIN ALFA 60 MCG/0.3ML IJ SOSY
60.0000 ug | PREFILLED_SYRINGE | INTRAMUSCULAR | Status: DC
  Administered 2018-10-04: 60 ug via INTRAVENOUS

## 2018-10-03 MED ORDER — CHLORHEXIDINE GLUCONATE CLOTH 2 % EX PADS: 6.0000 | MEDICATED_PAD | Freq: Every day | CUTANEOUS | Status: DC
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
## 2018-10-03 NOTE — Plan of Care (Signed)
  Problem: Pain Managment: Goal: General experience of comfort will improve Outcome: Progressing   

## 2018-10-03 NOTE — Clinical Social Work Note (Signed)
Talked with patient at bedside and advised him that dialysis coordinator still working on getting his dialysis set-up. CSW encouraged patient to not continuously ask the nurses about his discharge, and that CSW will inform him when his dialysis is set-up, and that he will not discharge (unless he leaves AMA) until the dialysis set-up process is complete and Eddie Watson expressed understanding. CSW will continue to follow, maintain contact with dialysis coordinator and facilitate discharge to [facility] once HD set-up.  Nyanna Heideman Givens, MSW, LCSW Licensed Clinical Social Worker [place] [phone]
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
## 2018-10-03 NOTE — Clinical Social Work Note (Signed)
CSW talked with Terri Piedra, renal navigator regarding patient and indicated that the MD at the [facility] is concerned about patient's prior HD compliance and had been told that patient will be ST rehab once discharged and this was discussed, as renal navigator had been informed previously that patient will be LTC at [facility]. renal navigator requested that CSW complete statement for [facility] HD coordinator regarding patient's Medicaid status and that he will be LTC at facility, and this was done.   CSW talked with patient the evening of 6/25 and provided an update regarding getting him set-up at a dialysis center in [place]. CSW also communicated with Cleon Dew admissions director at [facility] and provided an update. CSW will continue to follow, provide SW interventions services as needed and facilitate discharge to [facility] once HD set-up.  Jonnette Nuon Givens, MSW, LCSW Licensed Clinical Social Worker [place] [phone]
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
## 2018-10-03 NOTE — Progress Notes (Signed)
Renal Navigator left message for [facility] medical director's [phone] explaining that patient has been accepted as a long term care patient at [facility] in [place], not a rehab placement, as there seems to have been some miscommunication that may have been a factor in his denial at [facility] HD clinic in [place]. Renal Navigator also spoke with [facility] Admissions Coordinator to explain above. She asked that hospital CSW/V. Crawford send this to them in writing. Renal Navigator spoke with CSW to request and she agrees.  Renal Navigator left message with above for Nephrologist/Dr. Jonnie Finner. Renal Navigator will continue to follow closely to see if patient will now be accepted to [facility] in [place] for OP HD treatment.  Alphonzo Cruise, [place] Renal Navigator [phone]

## 2018-10-04 DIAGNOSIS — I10 Essential (primary) hypertension: Secondary | ICD-10-CM

## 2018-10-04 LAB — CBC
HCT: 28.7 % — ABNORMAL LOW (ref 39.0–52.0)
Hemoglobin: 8.3 g/dL — ABNORMAL LOW (ref 13.0–17.0)
MCH: 24.8 pg — ABNORMAL LOW (ref 26.0–34.0)
MCHC: 28.9 g/dL — ABNORMAL LOW (ref 30.0–36.0)
MCV: 85.7 fL (ref 80.0–100.0)
Platelets: 234 10*3/uL (ref 150–400)
RBC: 3.35 MIL/uL — ABNORMAL LOW (ref 4.22–5.81)
RDW: 19.8 % — ABNORMAL HIGH (ref 11.5–15.5)
WBC: 6.2 10*3/uL (ref 4.0–10.5)
nRBC: 0 % (ref 0.0–0.2)

## 2018-10-04 LAB — RENAL FUNCTION PANEL
Albumin: 2.3 g/dL — ABNORMAL LOW (ref 3.5–5.0)
Anion gap: 14 (ref 5–15)
BUN: 46 mg/dL — ABNORMAL HIGH (ref 8–23)
CO2: 23 mmol/L (ref 22–32)
Calcium: 8.1 mg/dL — ABNORMAL LOW (ref 8.9–10.3)
Chloride: 99 mmol/L (ref 98–111)
Creatinine, Ser: 6.45 mg/dL — ABNORMAL HIGH (ref 0.61–1.24)
GFR calc Af Amer: 10 mL/min — ABNORMAL LOW (ref >60)
GFR calc non Af Amer: 8 mL/min — ABNORMAL LOW (ref >60)
Glucose, Bld: 96 mg/dL (ref 70–99)
Phosphorus: 5.2 mg/dL — ABNORMAL HIGH (ref 2.5–4.6)
Potassium: 4.9 mmol/L (ref 3.5–5.1)
Sodium: 136 mmol/L (ref 135–145)

## 2018-10-04 LAB — GLUCOSE, CAPILLARY
Glucose-Capillary: 68 mg/dL — ABNORMAL LOW (ref 70–99)
Glucose-Capillary: 75 mg/dL (ref 70–99)
Glucose-Capillary: 160 mg/dL — ABNORMAL HIGH (ref 70–99)
Glucose-Capillary: 152 mg/dL — ABNORMAL HIGH (ref 70–99)

## 2018-10-04 MED ORDER — PENTAFLUOROPROP-TETRAFLUOROETH EX AERO: 1.0000 "application " | INHALATION_SPRAY | CUTANEOUS | Status: DC | PRN

## 2018-10-04 MED ORDER — LIDOCAINE-PRILOCAINE 2.5-2.5 % EX CREA: 1.0000 "application " | TOPICAL_CREAM | CUTANEOUS | Status: DC | PRN

## 2018-10-04 MED ORDER — HEPARIN SODIUM (PORCINE) 1000 UNIT/ML DIALYSIS: 1000.0000 [IU] | INTRAMUSCULAR | Status: DC | PRN

## 2018-10-04 MED ORDER — SODIUM CHLORIDE 0.9 % IV SOLN: 100.0000 mL | INTRAVENOUS | Status: DC | PRN

## 2018-10-04 MED ORDER — CALCITRIOL 0.5 MCG PO CAPS
ORAL_CAPSULE | ORAL | Status: AC
  Filled 2018-10-04: qty 1

## 2018-10-04 MED ORDER — LIDOCAINE HCL (PF) 1 % IJ SOLN: 5.0000 mL | INTRAMUSCULAR | Status: DC | PRN

## 2018-10-04 MED ORDER — HEPARIN SODIUM (PORCINE) 1000 UNIT/ML IJ SOLN
INTRAMUSCULAR | Status: AC
  Filled 2018-10-04: qty 4

## 2018-10-04 MED ORDER — ALTEPLASE 2 MG IJ SOLR: 2.0000 mg | Freq: Once | INTRAMUSCULAR | Status: DC | PRN

## 2018-10-04 MED ORDER — DARBEPOETIN ALFA 60 MCG/0.3ML IJ SOSY
PREFILLED_SYRINGE | INTRAMUSCULAR | Status: AC
  Filled 2018-10-04: qty 0.3
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
## 2018-10-04 NOTE — Progress Notes (Signed)
CSW received a phone call from teaching service that nephrology called and stated that the patient would be hard to place at an outpatient hemodialysis clinic due to his noncompliance with treatment in the past.   CSW stated that she read CSW Jaclyn Shaggy note from Friday stating that she will follow up with [facility] in [place] to see if the patient will be allowed to be placed.   CSW will continue to follow.   Domenic Schwab, MSW, [place]

## 2018-10-05 LAB — GLUCOSE, CAPILLARY
Glucose-Capillary: 98 mg/dL (ref 70–99)
Glucose-Capillary: 139 mg/dL — ABNORMAL HIGH (ref 70–99)
Glucose-Capillary: 216 mg/dL — ABNORMAL HIGH (ref 70–99)
Glucose-Capillary: 82 mg/dL (ref 70–99)

## 2018-10-05 LAB — RENAL FUNCTION PANEL
Albumin: 2.3 g/dL — ABNORMAL LOW (ref 3.5–5.0)
Anion gap: 12 (ref 5–15)
BUN: 29 mg/dL — ABNORMAL HIGH (ref 8–23)
CO2: 25 mmol/L (ref 22–32)
Calcium: 8.1 mg/dL — ABNORMAL LOW (ref 8.9–10.3)
Chloride: 100 mmol/L (ref 98–111)
Creatinine, Ser: 5.09 mg/dL — ABNORMAL HIGH (ref 0.61–1.24)
GFR calc Af Amer: 13 mL/min — ABNORMAL LOW (ref >60)
GFR calc non Af Amer: 11 mL/min — ABNORMAL LOW (ref >60)
Glucose, Bld: 104 mg/dL — ABNORMAL HIGH (ref 70–99)
Phosphorus: 4.9 mg/dL — ABNORMAL HIGH (ref 2.5–4.6)
Potassium: 4.3 mmol/L (ref 3.5–5.1)
Sodium: 137 mmol/L (ref 135–145)
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
## 2018-10-05 NOTE — Progress Notes (Signed)
PT wanted splint off of arm. RN spoke with MD on call and was given permission to take off splint. Warm wash cloth used to wash arm and lotion applied to dryness. Finger redressed with abx ointment and foam. Pt tolerated well.   Eleanora Neighbor, RN
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
## 2018-10-06 LAB — CBC
HCT: 28.3 % — ABNORMAL LOW (ref 39.0–52.0)
Hemoglobin: 8.3 g/dL — ABNORMAL LOW (ref 13.0–17.0)
MCH: 25.2 pg — ABNORMAL LOW (ref 26.0–34.0)
MCHC: 29.3 g/dL — ABNORMAL LOW (ref 30.0–36.0)
MCV: 86 fL (ref 80.0–100.0)
Platelets: 223 10*3/uL (ref 150–400)
RBC: 3.29 MIL/uL — ABNORMAL LOW (ref 4.22–5.81)
RDW: 20.1 % — ABNORMAL HIGH (ref 11.5–15.5)
WBC: 6.2 10*3/uL (ref 4.0–10.5)
nRBC: 0 % (ref 0.0–0.2)

## 2018-10-06 LAB — RENAL FUNCTION PANEL
Albumin: 2.4 g/dL — ABNORMAL LOW (ref 3.5–5.0)
Anion gap: 11 (ref 5–15)
BUN: 40 mg/dL — ABNORMAL HIGH (ref 8–23)
CO2: 24 mmol/L (ref 22–32)
Calcium: 8.1 mg/dL — ABNORMAL LOW (ref 8.9–10.3)
Chloride: 102 mmol/L (ref 98–111)
Creatinine, Ser: 6.13 mg/dL — ABNORMAL HIGH (ref 0.61–1.24)
GFR calc Af Amer: 10 mL/min — ABNORMAL LOW (ref >60)
GFR calc non Af Amer: 9 mL/min — ABNORMAL LOW (ref >60)
Glucose, Bld: 109 mg/dL — ABNORMAL HIGH (ref 70–99)
Phosphorus: 5.5 mg/dL — ABNORMAL HIGH (ref 2.5–4.6)
Potassium: 4.6 mmol/L (ref 3.5–5.1)
Sodium: 137 mmol/L (ref 135–145)

## 2018-10-06 LAB — GLUCOSE, CAPILLARY
Glucose-Capillary: 110 mg/dL — ABNORMAL HIGH (ref 70–99)
Glucose-Capillary: 79 mg/dL (ref 70–99)
Glucose-Capillary: 88 mg/dL (ref 70–99)
Glucose-Capillary: 122 mg/dL — ABNORMAL HIGH (ref 70–99)
Glucose-Capillary: 151 mg/dL — ABNORMAL HIGH (ref 70–99)

## 2018-10-06 MED ORDER — LIDOCAINE-PRILOCAINE 2.5-2.5 % EX CREA: 1.0000 "application " | TOPICAL_CREAM | CUTANEOUS | Status: DC | PRN

## 2018-10-06 MED ORDER — HEPARIN SODIUM (PORCINE) 1000 UNIT/ML DIALYSIS: 1000.0000 [IU] | INTRAMUSCULAR | Status: DC | PRN

## 2018-10-06 MED ORDER — SODIUM CHLORIDE 0.9 % IV SOLN: 100.0000 mL | INTRAVENOUS | Status: DC | PRN

## 2018-10-06 MED ORDER — ALTEPLASE 2 MG IJ SOLR: 2.0000 mg | Freq: Once | INTRAMUSCULAR | Status: DC | PRN

## 2018-10-06 MED ORDER — HEPARIN SODIUM (PORCINE) 1000 UNIT/ML IJ SOLN
INTRAMUSCULAR | Status: AC
  Filled 2018-10-06: qty 4

## 2018-10-06 MED ORDER — HEPARIN SODIUM (PORCINE) 1000 UNIT/ML DIALYSIS: 20.0000 [IU]/kg | INTRAMUSCULAR | Status: DC | PRN

## 2018-10-06 MED ORDER — PENTAFLUOROPROP-TETRAFLUOROETH EX AERO: 1.0000 "application " | INHALATION_SPRAY | CUTANEOUS | Status: DC | PRN

## 2018-10-06 MED ORDER — LIDOCAINE HCL (PF) 1 % IJ SOLN: 5.0000 mL | INTRAMUSCULAR | Status: DC | PRN

## 2018-10-06 NOTE — Progress Notes (Signed)
Pt just called stating he could not breathe. RN checked sats and they were ranging from 92-95 on room air. Pt also had vomited about 25cc of bile. Pt denied needing something for vomiting. Pt stated he felt better. Will continue to monitor.   Eleanora Neighbor, RN
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
## 2018-10-06 NOTE — Progress Notes (Signed)
Renal Navigator received call from CSW/V. Crawford inquiring as to whether patient still has an OP HD seat available to him at [facility] clinic as patient states he has been told different things and is feeling frustrated about continuing to be in the hospital. Renal Navigator understands patient's frustration as we continue to wait on [place] Director to decide whether or not to accept patient and confirmed with Clinic Manager at [facility] that patient still has a seat and at this point, his schedule will be MWF 12:15pm. Renal Navigator informed CSW. Renal Navigator asked for update from [place] regarding plan for patient.  Alphonzo Cruise,  Renal Navigator [phone]

## 2018-10-06 NOTE — Clinical Social Work Note (Addendum)
Talked with patient regarding his discharge plan and waiting to talk with an MD regarding patient and his dialysis at [facility] in [place], as efforts continue to get HD arranged in [place] so that patient can discharge to [facility]. [name] expressed frustration at being told different things by different people and impatient at still being in the hospital. Patient ready to leave this week and this was discussed. CSW talked with dialysis coordinator Terri Piedra to confirm his HD schedule at [facility] dialysis center. Contacted SCAT coordinator and confirmed that he is not certified with [facility]. Application printed and given to patient to complete. CSW will continue to follow, provide SW intervention services as needed and assist with discharge.  Eddie Watson, MSW, LCSW Licensed Clinical Social Worker [place] [phone]

## 2018-10-07 ENCOUNTER — Encounter (HOSPITAL_COMMUNITY)

## 2018-10-07 LAB — RENAL FUNCTION PANEL
Albumin: 2.5 g/dL — ABNORMAL LOW (ref 3.5–5.0)
Anion gap: 11 (ref 5–15)
BUN: 28 mg/dL — ABNORMAL HIGH (ref 8–23)
CO2: 25 mmol/L (ref 22–32)
Calcium: 8 mg/dL — ABNORMAL LOW (ref 8.9–10.3)
Chloride: 101 mmol/L (ref 98–111)
Creatinine, Ser: 5.24 mg/dL — ABNORMAL HIGH (ref 0.61–1.24)
GFR calc Af Amer: 13 mL/min — ABNORMAL LOW (ref >60)
GFR calc non Af Amer: 11 mL/min — ABNORMAL LOW (ref >60)
Glucose, Bld: 92 mg/dL (ref 70–99)
Phosphorus: 5 mg/dL — ABNORMAL HIGH (ref 2.5–4.6)
Potassium: 4.2 mmol/L (ref 3.5–5.1)
Sodium: 137 mmol/L (ref 135–145)

## 2018-10-07 LAB — GLUCOSE, CAPILLARY
Glucose-Capillary: 91 mg/dL (ref 70–99)
Glucose-Capillary: 157 mg/dL — ABNORMAL HIGH (ref 70–99)
Glucose-Capillary: 129 mg/dL — ABNORMAL HIGH (ref 70–99)
Glucose-Capillary: 142 mg/dL — ABNORMAL HIGH (ref 70–99)

## 2018-10-07 NOTE — Progress Notes (Signed)
Renal Navigator spoke with CSW/V. Crawford who has not received a call back from Dr. Rhona Leavens office regarding acceptance of patient to [facility]. CSW states she called again this morning with no answer. Renal Navigator contacted [facility] who contacted clinic. Clinic staff states they will contact MD again.  Renal Navigator continues to follow closely.  Alphonzo Cruise, [place] Renal Navigator [phone]

## 2018-10-07 NOTE — Clinical Social Work Note (Addendum)
Call made to Dr. Jacinto Reap. [place] [phone]) and message left. CSW was instructed on 6/29 to call this doctor regarding patient's dialysis and call made on Monday and message left. Update provided to [facility], dialysis coordinator regarding patient and patient's frustration and readiness to leave the hospital. CSW will continue to follow, provide patient with update, call Dr. Lowanda Foster again on Wednesday and provide SW intervention services as needed through discharge.  Visited with patient after 7 pm regarding his discharge. CSW informed by patient that he will discharge tomorrow and he needs to leave at 9 am. He has found a room in a boarding house ([address]) and he has get the money to pay the landlord for the room. Patient reported that a friend is coming to get him from the hospital at 9 am, so that he can meet with the landlord between 51 - 10 am. Eddie Watson will be transported to dialysis by [facility] transportation and has to be there at 12 noon. CSW informed by patient that he is certified with [facility] through November 2020 and will then recertify to continue hs transportation. Nurse informed of patient's plan to leave at 9 am Wednesday morning and asked to communicate with MD so that his discharge paperwork can be done before he leaves.  Eddie Watson, MSW, LCSW Licensed Clinical Social Worker [place] [phone]
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
## 2018-10-07 NOTE — Discharge Instructions (Signed)
You will receive dialysis at [facility].
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
## 2018-10-08 ENCOUNTER — Encounter (HOSPITAL_COMMUNITY)

## 2018-10-08 LAB — GLUCOSE, CAPILLARY: Glucose-Capillary: 82 mg/dL (ref 70–99)

## 2018-10-08 MED ORDER — CALCIUM CARBONATE 1250 (500 CA) MG PO TABS: 1.0000 | ORAL_TABLET | Freq: Every day | ORAL | Status: AC

## 2018-10-08 MED ORDER — CALCITRIOL 0.5 MCG PO CAPS: 0.5000 ug | ORAL_CAPSULE | ORAL | Status: AC

## 2018-10-08 MED ORDER — GATIFLOXACIN 0.5 % OP SOLN: 1.0000 [drp] | Freq: Four times a day (QID) | OPHTHALMIC | 0 refills | Status: AC

## 2018-10-08 MED ORDER — RENA-VITE PO TABS: 1.0000 | ORAL_TABLET | Freq: Every day | ORAL | 0 refills | Status: AC

## 2018-10-08 MED ORDER — PRO-STAT SUGAR FREE PO LIQD: 30.0000 mL | Freq: Two times a day (BID) | ORAL | 0 refills | Status: AC

## 2018-10-08 MED ORDER — MUPIROCIN CALCIUM 2 % EX CREA: TOPICAL_CREAM | Freq: Every day | CUTANEOUS | 0 refills | Status: AC

## 2018-10-08 MED ORDER — SODIUM CHLORIDE 0.9 % IV SOLN: 125.0000 mg | INTRAVENOUS | Status: AC

## 2018-10-08 NOTE — Progress Notes (Signed)
Renal Navigator notes CSW documentation and patient's wishes to discharge and return to OP HD clinic [place]. Renal Navigator has previously confirmed that he has a seat there if he should wish to return-MWF 12:15pm. Renal Navigator has updated clinic on patient's plans this morning per CSW note from 10/07/18.  Alphonzo Cruise, [place] Renal Navigator [phone]

## 2018-10-08 NOTE — TOC Transition Note (Addendum)
Transition of Care [facility]) - CM/SW Discharge Note 10/08/18 - Patient discharged home   Patient Details  Name: Chavez Rosol MRN: [id number] Date of Birth: Nov 25, 1955  Transition of Care [facility]) CM/SW Contact:  Sable Feil, LCSW Phone Number: 10/08/2018, 8:55 AM   Clinical Narrative:   Mr. Coppa chose to discharge today after securing a place to live (boarding house) and arranging MA transportation with [employer] on Tuesday, 6/30. Mr. Steidle will go to HD today at [facility] dialysis center.  Terri Piedra, dialysis coordinator was contacted and informed of patient's discharge and admissions director of [facility] also contacted and informed of patient's discharge home.   Final next level of care: Home/Self Care Barriers to Discharge: No Barriers Identified   Patient Goals and CMS Choice Patient states their goals for this hospitalization and ongoing recovery are:: Patient has an [facility] in [place] and has arranged transportation with [employer].gov Compare Post Acute Care list provided to:: Other (Comment Required)(Due to his Medicaid only status, patient was made aware that he might not have a choice of facility) Choice offered to / list presented to : Patient(Patient provided with SNF list)  Discharge Placement - home   Existing PASRR number confirmed : 09/25/18                Patient and family notified of of transfer: 10/08/18(Patient chose to discharge today as HD had not been set-up in [place])  Discharge Plan and Services  Patient discharged home - secured a room in a boarding house on 6/30.                                  Social Determinants of Health (SDOH) Interventions  Patient worked out his housing and transportation needs.   Readmission Risk Interventions No flowsheet data found.

## 2018-10-08 NOTE — Progress Notes (Signed)
DISCHARGE NOTE HOME Eddie Watson to be discharged home per MD order. Discussed prescriptions and follow up appointments with the patient. Prescriptions given to patient; medication list explained in detail. Patient verbalized understanding.  Skin clean, dry and intact without evidence of skin break down, no evidence of skin tears noted. IV catheter discontinued intact. Site without signs and symptoms of complications. Dressing and pressure applied. Pt denies pain at the site currently. No complaints noted.  Patient free of lines, drains, and wounds.   An After Visit Summary (AVS) was printed and given to the patient. Patient escorted via wheelchair, and discharged home via private auto.  Stephan Minister, RN

## 2018-12-16 ENCOUNTER — Ambulatory Visit: Payer: Medicaid Other | Admitting: Family Medicine

## 2018-12-25 ENCOUNTER — Ambulatory Visit: Payer: Medicaid Other | Admitting: Family Medicine

## 2019-03-09 ENCOUNTER — Emergency Department (HOSPITAL_COMMUNITY)
Admission: EM | Admit: 2019-03-09 | Discharge: 2019-03-09 | Disposition: A | Discharge status: Home / Self Care | Payer: Medicaid Other

## 2019-03-16 ENCOUNTER — Other Ambulatory Visit: Payer: Self-pay

## 2019-03-16 ENCOUNTER — Encounter (HOSPITAL_COMMUNITY): Payer: Self-pay | Admitting: Emergency Medicine

## 2019-03-16 ENCOUNTER — Emergency Department (HOSPITAL_COMMUNITY): Payer: Medicaid Other

## 2019-03-16 ENCOUNTER — Emergency Department (HOSPITAL_COMMUNITY)
Admission: EM | Admit: 2019-03-16 | Discharge: 2019-03-16 | Disposition: A | Discharge status: Home / Self Care | Payer: Medicaid Other | Attending: Emergency Medicine | Admitting: Emergency Medicine

## 2019-03-16 DIAGNOSIS — E11649 Type 2 diabetes mellitus with hypoglycemia without coma: Secondary | ICD-10-CM | POA: Insufficient documentation

## 2019-03-16 DIAGNOSIS — Z79899 Other long term (current) drug therapy: Secondary | ICD-10-CM | POA: Diagnosis not present

## 2019-03-16 DIAGNOSIS — I120 Hypertensive chronic kidney disease with stage 5 chronic kidney disease or end stage renal disease: Secondary | ICD-10-CM | POA: Diagnosis not present

## 2019-03-16 DIAGNOSIS — Z87891 Personal history of nicotine dependence: Secondary | ICD-10-CM | POA: Insufficient documentation

## 2019-03-16 DIAGNOSIS — R140 Abdominal distension (gaseous): Secondary | ICD-10-CM | POA: Diagnosis not present

## 2019-03-16 DIAGNOSIS — N186 End stage renal disease: Secondary | ICD-10-CM | POA: Insufficient documentation

## 2019-03-16 DIAGNOSIS — R197 Diarrhea, unspecified: Secondary | ICD-10-CM | POA: Diagnosis not present

## 2019-03-16 DIAGNOSIS — E875 Hyperkalemia: Secondary | ICD-10-CM | POA: Diagnosis not present

## 2019-03-16 DIAGNOSIS — E162 Hypoglycemia, unspecified: Secondary | ICD-10-CM

## 2019-03-16 DIAGNOSIS — E1122 Type 2 diabetes mellitus with diabetic chronic kidney disease: Secondary | ICD-10-CM | POA: Diagnosis not present

## 2019-03-16 DIAGNOSIS — Z9660 Presence of unspecified orthopedic joint implant: Secondary | ICD-10-CM | POA: Diagnosis not present

## 2019-03-16 DIAGNOSIS — Z992 Dependence on renal dialysis: Secondary | ICD-10-CM | POA: Insufficient documentation

## 2019-03-16 LAB — CBC WITH DIFFERENTIAL/PLATELET
Abs Immature Granulocytes: 0.02 10*3/uL (ref 0.00–0.07)
Basophils Absolute: 0.1 10*3/uL (ref 0.0–0.1)
Basophils Relative: 1 %
Eosinophils Absolute: 0 10*3/uL (ref 0.0–0.5)
Eosinophils Relative: 0 %
HCT: 31.2 % — ABNORMAL LOW (ref 39.0–52.0)
Hemoglobin: 8.7 g/dL — ABNORMAL LOW (ref 13.0–17.0)
Immature Granulocytes: 0 %
Lymphocytes Relative: 11 %
Lymphs Abs: 0.9 10*3/uL (ref 0.7–4.0)
MCH: 26 pg (ref 26.0–34.0)
MCHC: 27.9 g/dL — ABNORMAL LOW (ref 30.0–36.0)
MCV: 93.4 fL (ref 80.0–100.0)
Monocytes Absolute: 0.8 10*3/uL (ref 0.1–1.0)
Monocytes Relative: 11 %
Neutro Abs: 5.9 10*3/uL (ref 1.7–7.7)
Neutrophils Relative %: 77 %
Platelets: 290 10*3/uL (ref 150–400)
RBC: 3.34 MIL/uL — ABNORMAL LOW (ref 4.22–5.81)
RDW: 18.4 % — ABNORMAL HIGH (ref 11.5–15.5)
WBC: 7.7 10*3/uL (ref 4.0–10.5)
nRBC: 0 % (ref 0.0–0.2)

## 2019-03-16 LAB — I-STAT CHEM 8, ED
BUN: 68 mg/dL — ABNORMAL HIGH (ref 8–23)
Calcium, Ion: 1.02 mmol/L — ABNORMAL LOW (ref 1.15–1.40)
Chloride: 106 mmol/L (ref 98–111)
Creatinine, Ser: 9.3 mg/dL — ABNORMAL HIGH (ref 0.61–1.24)
Glucose, Bld: 119 mg/dL — ABNORMAL HIGH (ref 70–99)
HCT: 32 % — ABNORMAL LOW (ref 39.0–52.0)
Hemoglobin: 10.9 g/dL — ABNORMAL LOW (ref 13.0–17.0)
Potassium: 5.7 mmol/L — ABNORMAL HIGH (ref 3.5–5.1)
Sodium: 137 mmol/L (ref 135–145)
TCO2: 20 mmol/L — ABNORMAL LOW (ref 22–32)

## 2019-03-16 LAB — COMPREHENSIVE METABOLIC PANEL
ALT: 13 U/L (ref 0–44)
AST: 29 U/L (ref 15–41)
Albumin: 2.1 g/dL — ABNORMAL LOW (ref 3.5–5.0)
Alkaline Phosphatase: 109 U/L (ref 38–126)
Anion gap: 19 — ABNORMAL HIGH (ref 5–15)
BUN: 56 mg/dL — ABNORMAL HIGH (ref 8–23)
CO2: 17 mmol/L — ABNORMAL LOW (ref 22–32)
Calcium: 8.5 mg/dL — ABNORMAL LOW (ref 8.9–10.3)
Chloride: 104 mmol/L (ref 98–111)
Creatinine, Ser: 9.21 mg/dL — ABNORMAL HIGH (ref 0.61–1.24)
GFR calc Af Amer: 6 mL/min — ABNORMAL LOW (ref >60)
GFR calc non Af Amer: 5 mL/min — ABNORMAL LOW (ref >60)
Glucose, Bld: 128 mg/dL — ABNORMAL HIGH (ref 70–99)
Potassium: 5.4 mmol/L — ABNORMAL HIGH (ref 3.5–5.1)
Sodium: 140 mmol/L (ref 135–145)
Total Bilirubin: 1 mg/dL (ref 0.3–1.2)
Total Protein: 7.2 g/dL (ref 6.5–8.1)

## 2019-03-16 LAB — CBG MONITORING, ED
Glucose-Capillary: 111 mg/dL — ABNORMAL HIGH (ref 70–99)
Glucose-Capillary: 156 mg/dL — ABNORMAL HIGH (ref 70–99)

## 2019-03-16 MED ORDER — SODIUM ZIRCONIUM CYCLOSILICATE 10 G PO PACK
10.0000 g | PACK | Freq: Once | ORAL | Status: AC
  Administered 2019-03-16: 10 g via ORAL
  Filled 2019-03-16: qty 1

## 2019-03-16 NOTE — Progress Notes (Signed)
Renal Navigator received call from Renal PA/K. Stovall stating that patient is cleared to [facility] today. Navigator contacted ED CSW/C. Shon Baton to arrange transportation. Patient will be given a cab voucher to HD clinic and be picked up by his SNF/[facility] when he has completed tx. [facility] clinic aware to call SNF when patient is ready for pick up.  Alphonzo Cruise, [place] Renal Navigator [phone]

## 2019-03-16 NOTE — ED Provider Notes (Signed)
[facility] EMERGENCY DE[facility]MENT Provider Note   CSN: [id number] Arrival date & time:        History   Chief Complaint Chief Complaint  Patient presents with   Hypoglycemia    HPI [facility] is a 63 y.o. male.     Patient is a 63 year old male who resides at [facility] and presents with hypoglycemia.  He has a history of end-stage renal disease and is on dialysis for about the last year per patient report.  Also has a history of hypertension and there is a reported history of diabetes on his chart although patient denies this.  He states he is not on any diabetes medications.  He was noted to have a hypoglycemia this morning with a blood sugar of 30.  He was given D10 by EMS with improvement of his sugar.  He says that he just needs to go to dialysis.  He is missed his last 2 dialysis on Wednesday and Friday of last week because of diarrhea.  He has chronic diarrhea with C. difficile colonization.  He states his symptoms are unchanged and that they improve with Imodium.  However he did not go to his dialysis because of the diarrhea on Wednesday and Friday.  He says that he needs dialysis.  He denies any shortness of breath other than at night he wears oxygen but denies any current shortness of breath.  No fevers.  No cough or cold.  No abdominal pain.     Past Medical History:  Diagnosis Date   Chronic diarrhea    Diabetes mellitus without complication ([place])    Hypertension    Renal disorder    Seizures [facility])     Patient Active Problem List   Diagnosis Date Noted   Laceration of right ring finger with damage to nail without foreign body    Azotemia    Pleural effusion, right    Injury of right wrist    Noncompliance    Volume overload 09/23/2018   Elevated serum creatinine    Poor social situation    Bacteremia due to Klebsiella pneumoniae 05/15/2018   Sepsis ([place]) 05/14/2018   Seizure ([place]) 05/14/2018   End  stage renal disease ([place]) 05/14/2018   Chest pain 09/23/2013   Diabetes ([place]) 09/23/2013   Chronic diarrhea 09/23/2013   HTN (hypertension) 09/23/2013   Chest pain at rest 09/23/2013   Anemia 09/23/2013    Past Surgical History:  Procedure Laterality Date   CHOLECYSTECTOMY     IR THORACENTESIS ASP PLEURAL SPACE W/IMG GUIDE  10/01/2018   JOINT REPLACEMENT Right     x 2        Home Medications    Prior to Admission medications   Medication Sig Start Date End Date Taking? Authorizing Provider  Amino Acids-Protein Hydrolys (FEEDING SUPPLEMENT, PRO-STAT SUGAR FREE 64,) LIQD Take 30 mLs by mouth 2 (two) times daily. 10/08/18   Anderson, Chelsey L, DO  amLODipine (NORVASC) 10 MG tablet Take 10 mg by mouth daily.    [provider]  brimonidine (ALPHAGAN) 0.2 % ophthalmic solution Place 1 drop into the left eye 2 (two) times a day. 09/16/18   [provider]  calcitRIOL (ROCALTROL) 0.5 MCG capsule Take 1 capsule (0.5 mcg total) by mouth Every Tuesday,Thursday,and Saturday with dialysis. 10/08/18   Anderson, Chelsey L, DO  calcium carbonate (OS-CAL - DOSED IN MG OF ELEMENTAL CALCIUM) 1250 (500 Ca) MG tablet Take 1 tablet (500 mg of elemental calcium  total) by mouth at bedtime. 10/08/18   Anderson, Chelsey L, DO  dorzolamide (TRUSOPT) 2 % ophthalmic solution Place 1 drop into the left eye 2 (two) times a day. 09/16/18   [provider]  erythromycin ophthalmic ointment Place 1 application into the left eye at bedtime. 09/16/18   [provider]  ferric gluconate 125 mg in sodium chloride 0.9 % 100 mL Inject 125 mg into the vein Every Tuesday,Thursday,and Saturday with dialysis. 10/08/18   Anderson, Chelsey L, DO  gatifloxacin (ZYMAXID) 0.5 % SOLN Place 1 drop into the left eye 4 (four) times daily. Until follow up with Optho 10/08/18   Doristine Mango L, DO  labetalol (NORMODYNE) 200 MG tablet Take 200 mg by mouth 2 (two) times daily.    [provider]    latanoprost (XALATAN) 0.005 % ophthalmic solution Place 1 drop into the left eye at bedtime. 09/16/18   [provider]  loperamide (IMODIUM) 2 MG capsule Take 2 mg by mouth as needed for diarrhea or loose stools.    [provider]  multivitamin (RENA-VIT) TABS tablet Take 1 tablet by mouth at bedtime. 10/08/18   Anderson, Chelsey L, DO  mupirocin cream (BACTROBAN) 2 % Apply topically daily. 10/08/18   Anderson, Chelsey L, DO  pantoprazole (PROTONIX) 40 MG tablet Take 40 mg by mouth 2 (two) times daily.    [provider]  prednisoLONE acetate (PRED FORTE) 1 % ophthalmic suspension Place 1 drop into the left eye 2 (two) times a day. 09/01/18   [provider]  timolol (TIMOPTIC) 0.5 % ophthalmic solution Place 1 drop into the left eye 2 (two) times a day. 09/16/18   [provider]    Family History No family history on file.  Social History Social History   Tobacco Use   Smoking status: Former Smoker   Smokeless tobacco: Never Used  Substance Use Topics   Alcohol use: No   Drug use: No     Allergies   Penicillins and Tomato   Review of Systems Review of Systems  Constitutional: Negative for chills, diaphoresis, fatigue and fever.  HENT: Negative for congestion, rhinorrhea and sneezing.   Eyes: Negative.   Respiratory: Positive for shortness of breath. Negative for cough and chest tightness.   Cardiovascular: Negative for chest pain and leg swelling.  Gastrointestinal: Positive for abdominal distention and diarrhea. Negative for abdominal pain, blood in stool, nausea and vomiting.  Genitourinary: Negative for difficulty urinating, flank pain, frequency and hematuria.  Musculoskeletal: Negative for arthralgias and back pain.  Skin: Negative for rash.  Neurological: Negative for dizziness, speech difficulty, weakness, numbness and headaches.     Physical Exam Updated Vital Signs BP (!) 147/88    Pulse 73    Temp (!) 97.5 F (36.4  C) (Oral)    Resp 15    Wt 77 kg    SpO2 100%    BMI 24.36 kg/m   Physical Exam Constitutional:      Appearance: He is well-developed.  HENT:     Head: Normocephalic and atraumatic.  Eyes:     Pupils: Pupils are equal, round, and reactive to light.  Neck:     Musculoskeletal: Normal range of motion and neck supple.  Cardiovascular:     Rate and Rhythm: Normal rate and regular rhythm.     Heart sounds: Normal heart sounds.  Pulmonary:     Effort: Pulmonary effort is normal. No respiratory distress.     Breath sounds: Normal  breath sounds. No wheezing or rales.     Comments: Few crackles in the bases Chest:     Chest wall: No tenderness.  Abdominal:     General: Bowel sounds are normal. There is distension.     Palpations: Abdomen is soft.     Tenderness: There is no abdominal tenderness. There is no guarding or rebound.     Comments: Tympanic to percussion  Musculoskeletal: Normal range of motion.  Lymphadenopathy:     Cervical: No cervical adenopathy.  Skin:    General: Skin is warm and dry.     Findings: No rash.  Neurological:     Mental Status: He is alert and oriented to person, place, and time.      ED Treatments / Results  Labs (all labs ordered are listed, but only abnormal results are displayed) Labs Reviewed  COMPREHENSIVE METABOLIC PANEL - Abnormal; Notable for the following components:      Result Value   Potassium 5.4 (*)    CO2 17 (*)    Glucose, Bld 128 (*)    BUN 56 (*)    Creatinine, Ser 9.21 (*)    Calcium 8.5 (*)    Albumin 2.1 (*)    GFR calc non Af Amer 5 (*)    GFR calc Af Amer 6 (*)    Anion gap 19 (*)    All other components within normal limits  CBC WITH DIFFERENTIAL/PLATELET - Abnormal; Notable for the following components:   RBC 3.34 (*)    Hemoglobin 8.7 (*)    HCT 31.2 (*)    MCHC 27.9 (*)    RDW 18.4 (*)    All other components within normal limits  CBG MONITORING, ED - Abnormal; Notable for the following components:    Glucose-Capillary 111 (*)    All other components within normal limits  I-STAT CHEM 8, ED - Abnormal; Notable for the following components:   Potassium 5.7 (*)    BUN 68 (*)    Creatinine, Ser 9.30 (*)    Glucose, Bld 119 (*)    Calcium, Ion 1.02 (*)    TCO2 20 (*)    Hemoglobin 10.9 (*)    HCT 32.0 (*)    All other components within normal limits  CBG MONITORING, ED - Abnormal; Notable for the following components:   Glucose-Capillary 156 (*)    All other components within normal limits    EKG EKG Interpretation  Date/Time:  Monday March 16 2019 07:46:20 EST Ventricular Rate:  74 PR Interval:    QRS Duration: 78 QT Interval:  618 QTC Calculation: 686 R Axis:   58 Text Interpretation: Sinus rhythm Low voltage, extremity leads RSR' in V1 or V2, right VCD or RVH Nonspecific T abnormalities, anterior leads Prolonged QT interval since last tracing no significant change Confirmed by Malvin Johns [phone]) on 03/16/2019 9:17:42 AM   Radiology Acute Abd Series  Result Date: 03/16/2019 CLINICAL DATA:  63 year old male with abdominal distension. In stage renal disease. History of right side hydropneumothorax drainage at [facility] last month. EXAM: DG ABDOMEN ACUTE W/ 1V CHEST COMPARISON:  Portable chest 10/01/2018. Chest and abdomen noncontrast CT 02/10/2019. FINDINGS: Supine AP view of the chest at 0810 hours. Cardiomegaly. Visualized tracheal air column is within normal limits. Right chest dual lumen dialysis type catheter. The left lung is clear. There is small volume residual right pleural fluid, including in the right minor fissure. Minor associated right lung atelectasis.  Left-side-down lateral decubitus view of the abdomen. No pneumoperitoneum. On both views of the abdomen the stomach is distended with air and fluid. Paucity of bowel gas elsewhere, and great hazy opacity superimposed in the abdomen which might indicate ascites. Stable  cholecystectomy clips. Pelvic vascular calcifications. No acute osseous abnormality identified. IMPRESSION: 1. Small volume residual right pleural effusion with mild right lung atelectasis. 2. Gastric distention with air and fluid, but otherwise nonobstructed bowel-gas pattern. 3. No free air.  Query ascites. 4. Stable cardiomegaly. Electronically Signed   By: Genevie Ann M.D.   On: 03/16/2019 08:30    Procedures Procedures (including critical care time)  Medications Ordered in ED Medications  sodium zirconium cyclosilicate (LOKELMA) packet 10 g (has no administration in time range)     Initial Impression / Assessment and Plan / ED Course  I have reviewed the triage vital signs and the nursing notes.  Pertinent labs & imaging results that were available during my care of the patient were reviewed by me and considered in my medical decision making (see chart for details).        Patient is a 63 year old male who presents with hypoglycemia.  His sugar has been stable since arrival to the ED.  It does not appear that he is on any glucose lowering medications.  He also has diarrhea which appears to be chronic.  He has not had any episodes in the ED.  He has no abdominal tenderness.  His x-ray shows some gastric distention but no obstruction.  He has had no vomiting.  No fevers.  He has been seen by nephrology and was given [facility] for his mild elevation in potassium.  Social work/renal navigator has arranged for him to go directly to dialysis from the ED with a cab voucher.  Return precautions were given.  Final Clinical Impressions(s) / ED Diagnoses   Final diagnoses:  Hypoglycemia  Hyperkalemia    ED Discharge Orders    None       Malvin Johns, MD 03/16/19 [phone]

## 2019-03-16 NOTE — ED Notes (Signed)
Patient verbalizes understanding of discharge instructions. Opportunity for questioning and answers were provided. Patient given Taxi Voucher for transport to dialysis center. Armband removed by staff, pt discharged from ED.

## 2019-03-16 NOTE — ED Notes (Signed)
Checked patient cbg it was 5 notified RN of blood sugar patient is resting with call bell in reach

## 2019-03-16 NOTE — Progress Notes (Signed)
Pt seen in ED, pt is stable clinically in no distress. Came for low BS's and BS's now are normal and patient feels fine.  K is 5.7, will give Lokelma and will anticipate getting him dc'd from ED and over to 2nd shift dialysis at his OP unit.   Kelly Splinter, MD 03/16/2019, 8:39 AM

## 2019-03-16 NOTE — ED Triage Notes (Signed)
Pt transported from [facility] after hypoglycemia this am in 30's when staff was attempting to send patient to HD, EMS gave D10 IV BS up to 109.  Pt missed W, F last week due to diarrhea.

## 2019-03-16 NOTE — Discharge Planning (Signed)
EDCM provided taxi voucher for pt transportation to HD clinic.

## 2019-03-23 ENCOUNTER — Telehealth: Payer: Self-pay | Admitting: *Deleted

## 2019-03-23 ENCOUNTER — Emergency Department (HOSPITAL_COMMUNITY)
Admission: EM | Admit: 2019-03-23 | Discharge: 2019-04-10 | Disposition: E | Payer: Medicaid Other | Attending: Emergency Medicine | Admitting: Emergency Medicine

## 2019-03-23 DIAGNOSIS — N186 End stage renal disease: Secondary | ICD-10-CM | POA: Insufficient documentation

## 2019-03-23 DIAGNOSIS — Z992 Dependence on renal dialysis: Secondary | ICD-10-CM | POA: Diagnosis not present

## 2019-03-23 DIAGNOSIS — I469 Cardiac arrest, cause unspecified: Secondary | ICD-10-CM | POA: Insufficient documentation

## 2019-03-23 MED ORDER — CALCIUM CHLORIDE 10 % IV SOLN
INTRAVENOUS | Status: AC | PRN
  Administered 2019-03-23: 1 g via INTRAVENOUS

## 2019-03-23 MED ORDER — SODIUM BICARBONATE 8.4 % IV SOLN
INTRAVENOUS | Status: AC | PRN
  Administered 2019-03-23: 50 meq via INTRAVENOUS

## 2019-04-10 NOTE — Progress Notes (Signed)
Pt arrived via EMS. Active CPR upon arrival. Pt given manual breaths by RT through kings airway with HEPA filter via ambubag.

## 2019-04-10 NOTE — Telephone Encounter (Signed)
EDCM received call from [facility] main office regarding pt status and asking for [place] in ED.  [facility] provided RN number.

## 2019-04-10 NOTE — ED Triage Notes (Signed)
Pt arrived to the ED cpr in progress from Dialysis with a king airway , pt coded in Dialysis about 30 min s into his treatment first rhythm Pea , pt received 8 epi's 1 amp bicarb , 1 calcium , Vtac that was shocked by ems , pt arrived to the ED cpr in progress being bagged through a king with copious amounts of blood coming from mouth

## 2019-04-10 NOTE — ED Provider Notes (Signed)
[facility] EMERGENCY DEPARTMENT Provider Note   CSN: [id number] Arrival date & time: 04-11-2019  [id number]     History Chief Complaint  Patient presents with  . cpr    Eddie [facility] is a 64 y.o. male. Level 5 caveat due to cardiac arrest. HPI  Patient presented in cardiac arrest.  Reportedly arrested while in dialysis.  Potential downtime of 5 minutes before being seen.  Found in a PEA.  King airway placed.  Reportedly had around 3-1/2 hours of dialysis left.  Dialyzed with chest wall catheter.  Had epinephrine bicarb and calcium by EMS.  Had one episode of V. fib that he was defibrillated for.  However had a narrow PEA the whole time.  Had around 40 minutes of cardiac arrest upon arrival.    History of chronic diarrhea diabetes and end-stage renal disease on dialysis. No past medical history on file.  There are no problems to display for this patient.        No family history on file.  Social History   Tobacco Use  . Smoking status: Not on file  Substance Use Topics  . Alcohol use: Not on file  . Drug use: Not on file    Home Medications Prior to Admission medications   Not on File    Allergies    Patient has no allergy information on record.  Review of Systems   Review of Systems  Unable to perform ROS: Intubated    Physical Exam Updated Vital Signs There were no vitals taken for this visit.  Physical Exam Vitals and nursing note reviewed.  HENT:     Mouth/Throat:     Comments: Patient with King airway in place with blood coming up to tube. Eyes:     Comments: Pupils nonreactive.  Cardiovascular:     Comments: No pulse. Pulmonary:     Comments: Equal breath sounds with bagging bilaterally.  Dialysis catheter right chest wall. Abdominal:     General: There is distension.  Musculoskeletal:     Right lower leg: Edema present.     Left lower leg: Edema present.  Skin:    General: Skin is warm.  Neurological:     Comments:  Unresponsive.  No breaths.  In cardiac arrest.     ED Results / Procedures / Treatments   Labs (all labs ordered are listed, but only abnormal results are displayed) Labs Reviewed - No data to display  EKG None  Radiology No results found.  Procedures Procedures (including critical care time)  Medications Ordered in ED Medications - No data to display  ED Course  I have reviewed the triage vital signs and the nursing notes.  Pertinent labs & imaging results that were available during my care of the patient were reviewed by me and considered in my medical decision making (see chart for details).    MDM Rules/Calculators/A&P                             Patient presents in cardiac arrest.  Came from dialysis.  Had epinephrine bicarb and calcium prehospital.  Given bicarb and calcium here.  No return of vitals.  PEA/asystole on monitor.  Equal breath sounds bilaterally.  Downtime greater than 40 minutes.  Time of death 37.  Discussed with Randalyn Rhea the medical examiner.  Not medical examiner case.  Cardiopulmonary Resuscitation (CPR) Procedure Note Directed/Performed by: Davonna Belling I personally directed ancillary staff and/or  performed CPR in an effort to regain return of spontaneous circulation and to maintain cardiac, neuro and systemic perfusion.   Final Clinical Impression(s) / ED Diagnoses Final diagnoses:  Cardiac arrest ([place])  End stage renal disease on dialysis [facility])    Rx / [facility] Orders ED Discharge Orders    None       Davonna Belling, MD 04/14/2019 [phone]

## 2019-04-10 NOTE — ED Notes (Signed)
Unable to contact  Next of kin, left a message for his Brother that is listed on Nursing home  Contacts to call the hospital [facility]  [phone])

## 2019-04-10 DEATH — deceased

## 2020-05-09 IMAGING — DX RIGHT WRIST - COMPLETE 3+ VIEW
4 series · 4 of 4 positions shown · non-contrast
Comparison: None.

CLINICAL DATA: Pain after a fall on [REDACTED].

EXAM:
RIGHT WRIST - COMPLETE 3+ VIEW

[wrist pa]
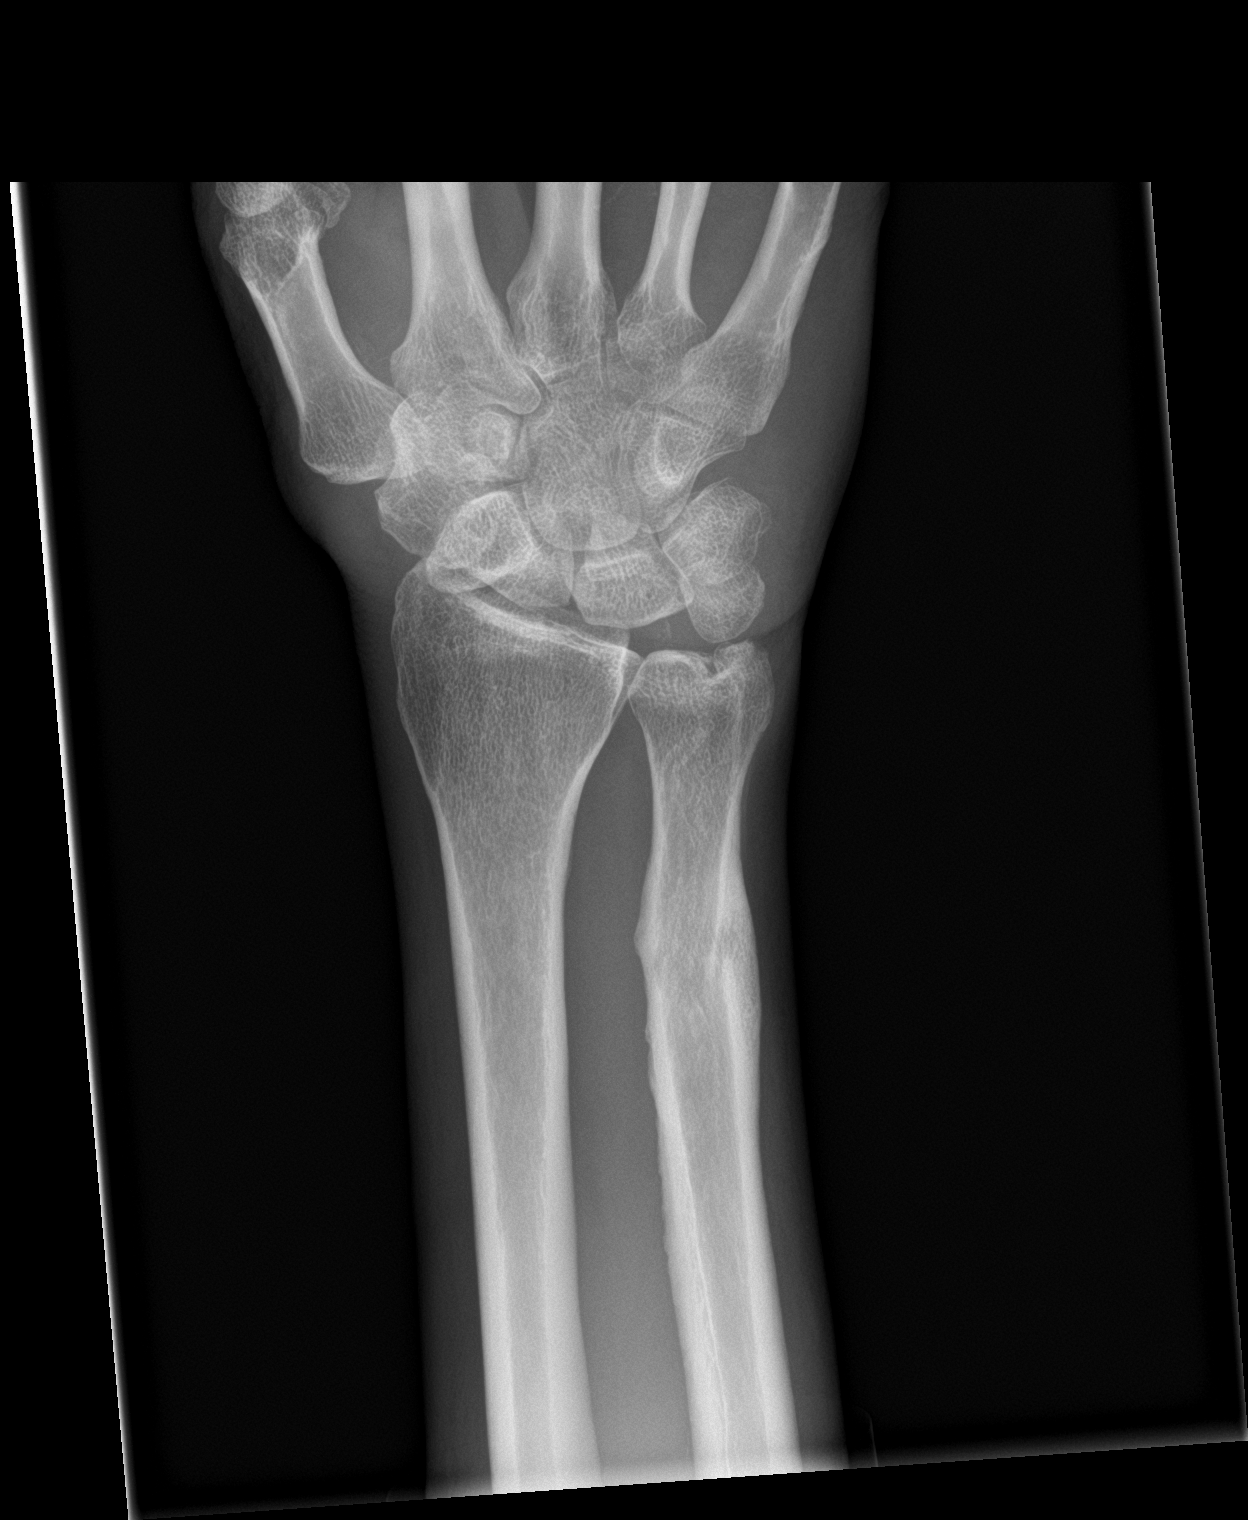

[wrist obl]
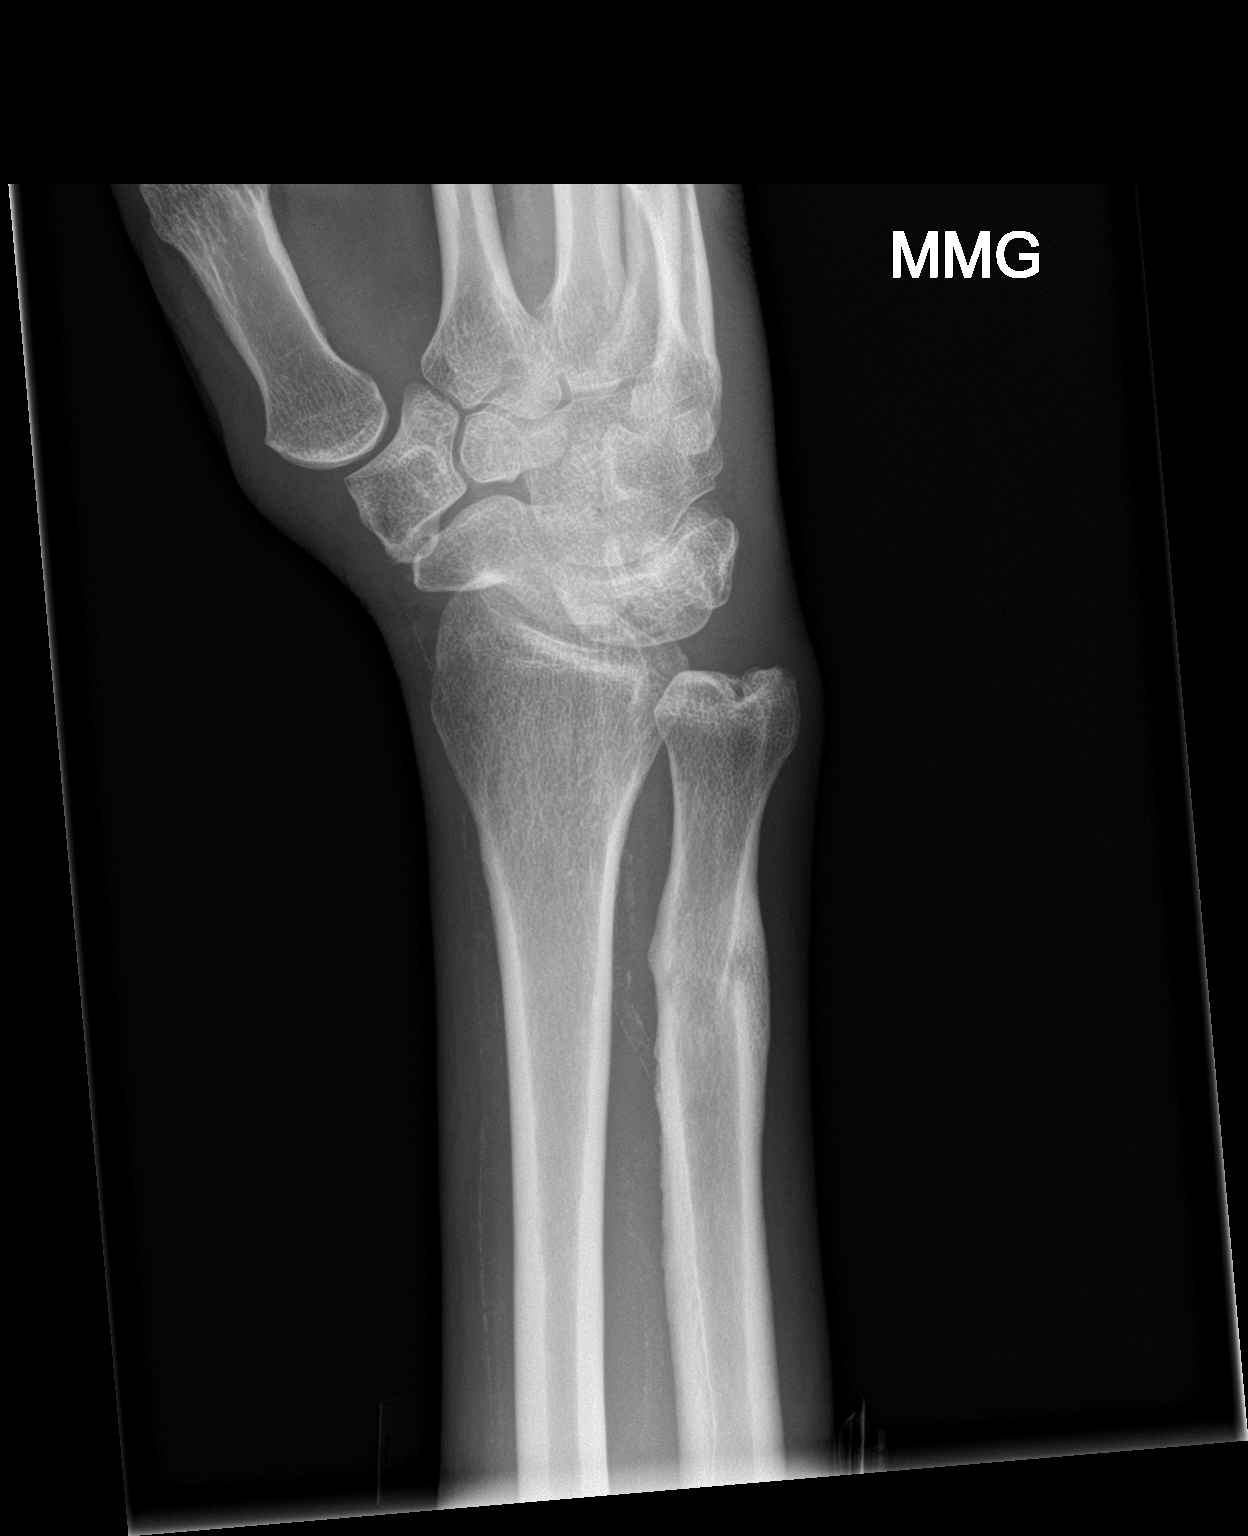

[wrist navicular]
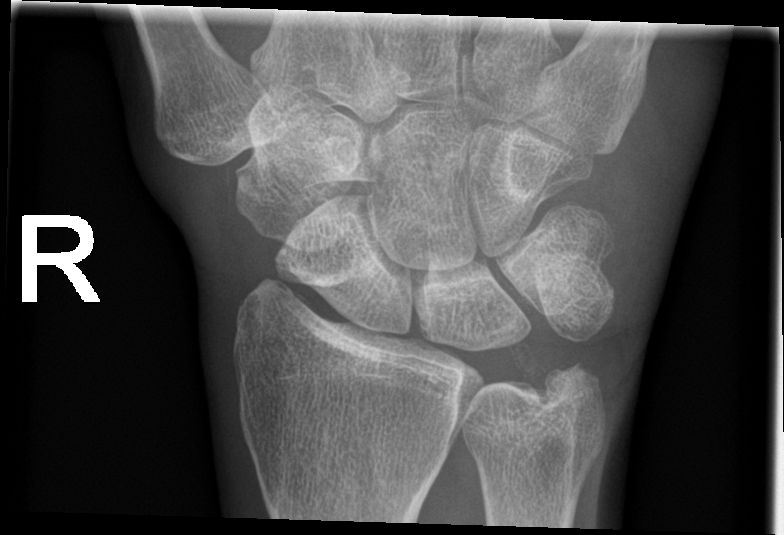

[wrist lat]
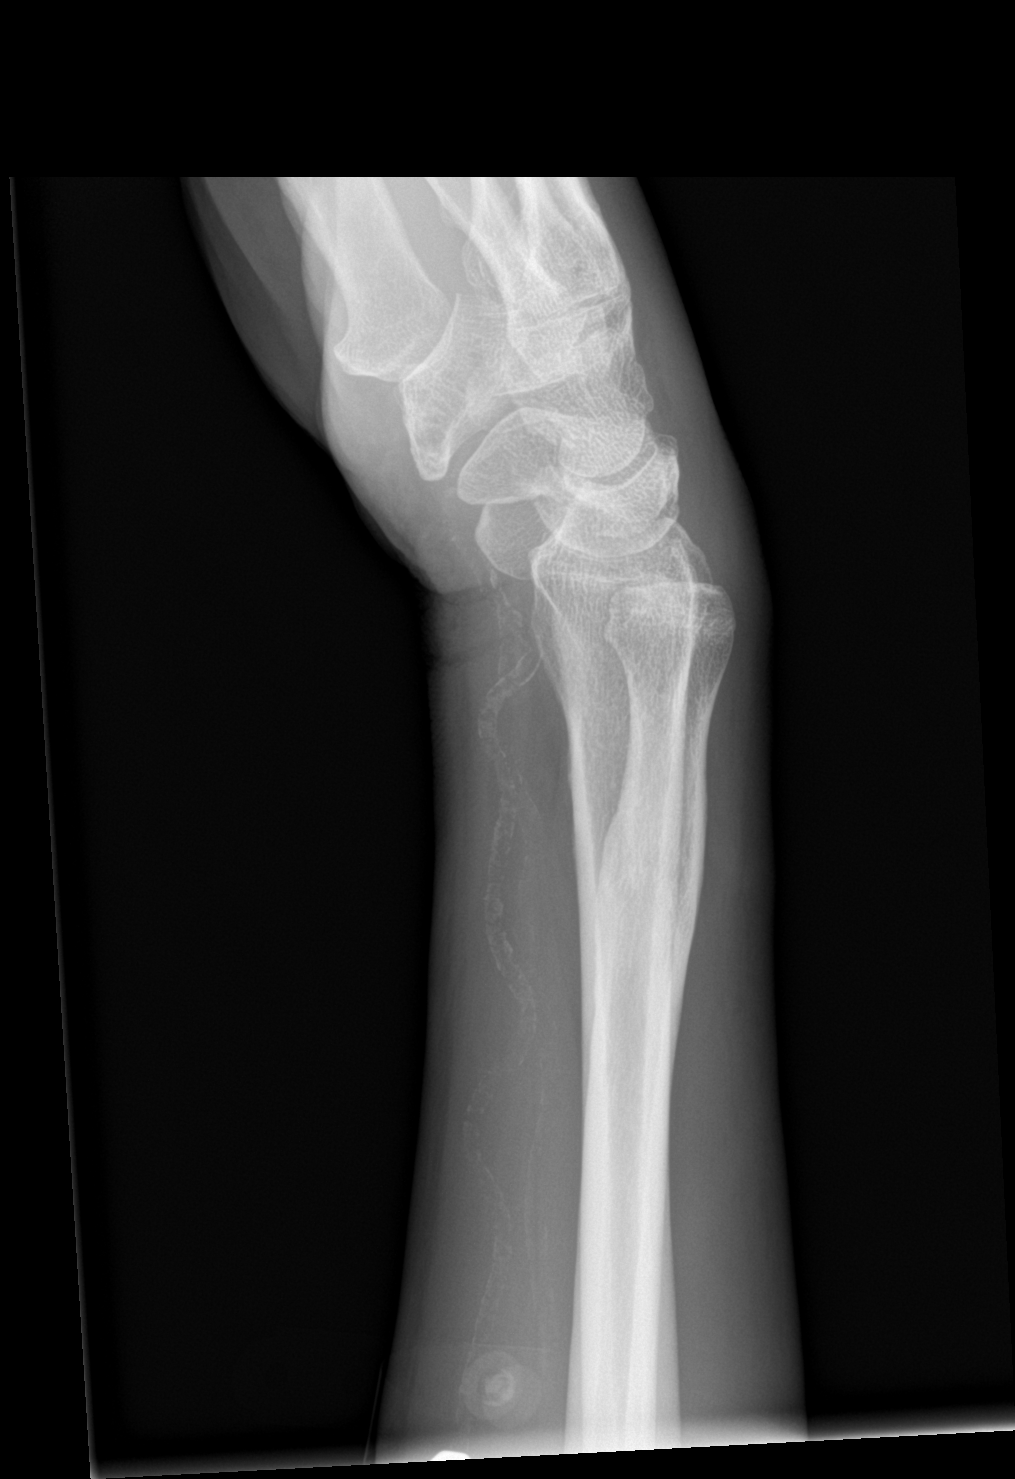

[4 of 4 positions shown; findings below may reference images not displayed]

FINDINGS: Remote fracture involving the distal ulna. Advanced vascular
calcifications. No acute fracture or dislocation. Scaphoid intact.
Lateral view mildly oblique.
IMPRESSION: No acute osseous abnormality.

## 2020-05-10 IMAGING — DX RIGHT HAND - 2 VIEW
2 series · 2 of 2 positions shown · non-contrast
Comparison: None.

CLINICAL DATA: Pain

EXAM:
RIGHT HAND - 2 VIEW

[hand ap]
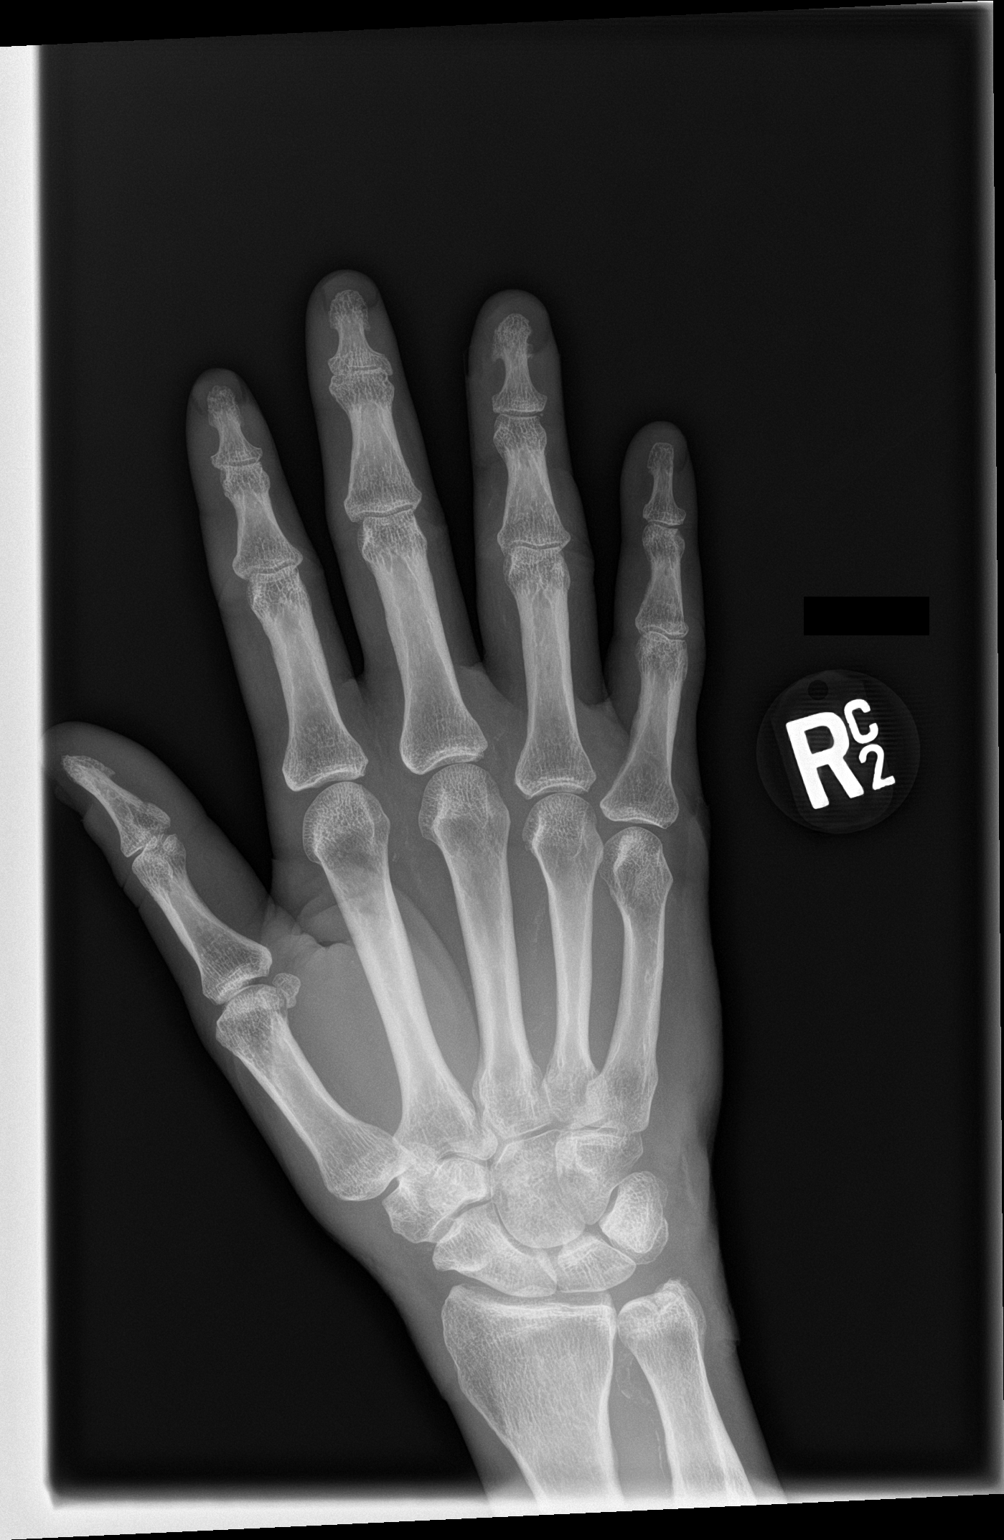

[hand lat]
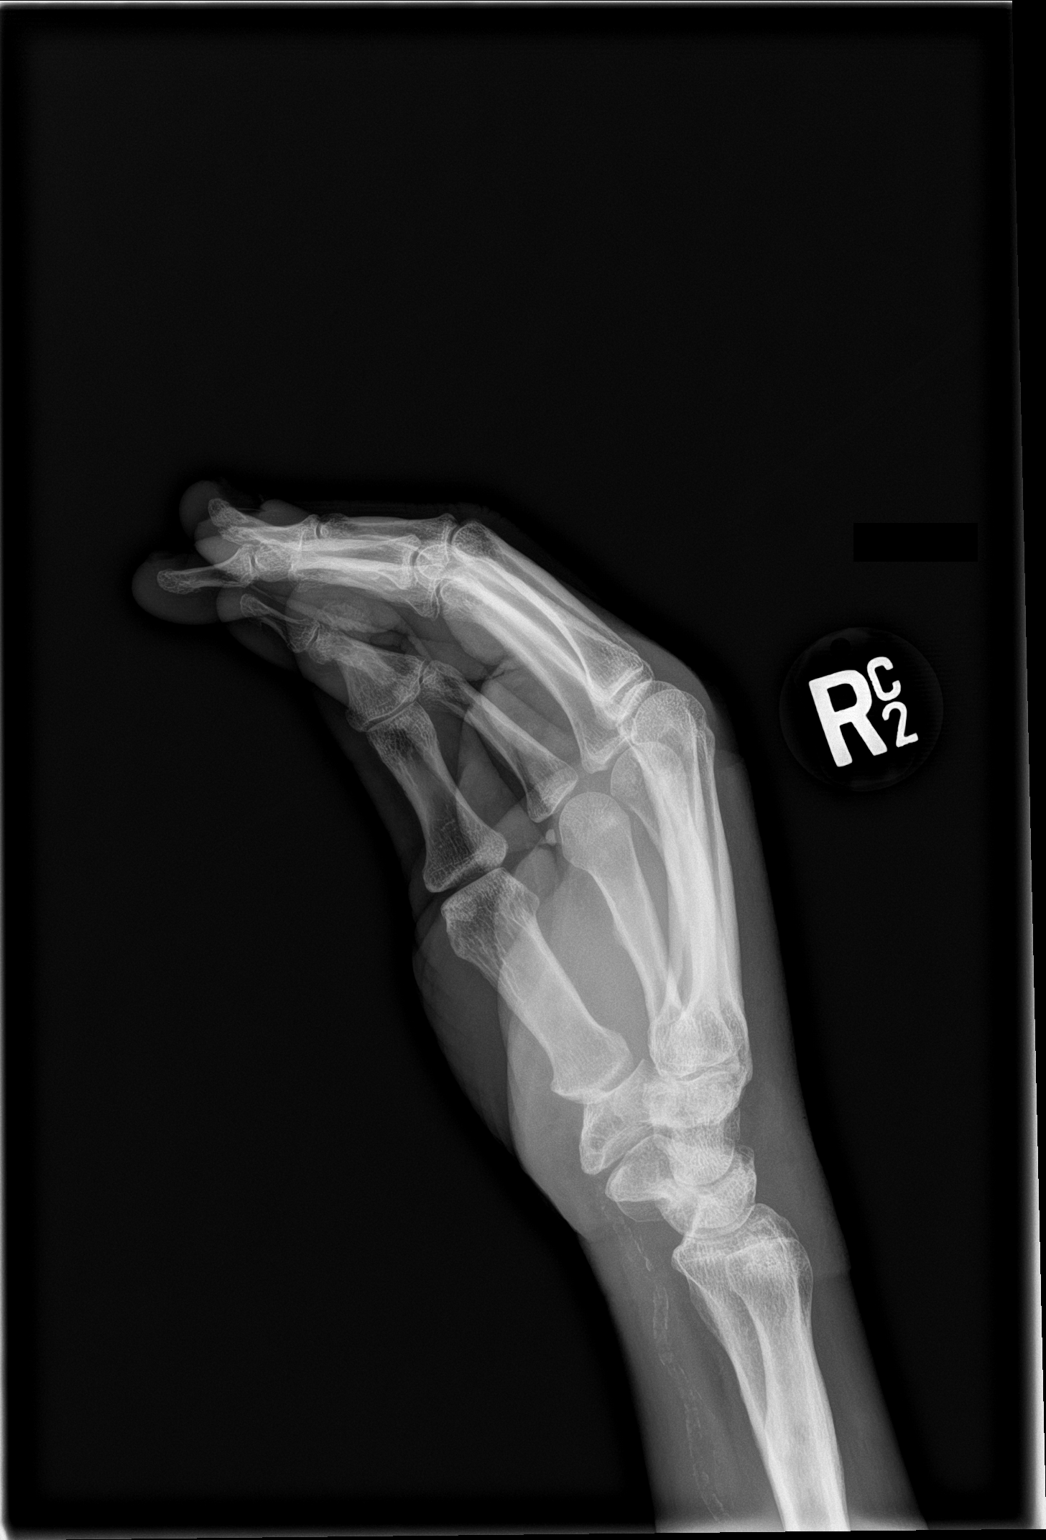

[2 of 2 positions shown; findings below may reference images not displayed]

FINDINGS: There is no evidence of fracture or dislocation. There is no
evidence of arthropathy or other focal bone abnormality. Soft
tissues are unremarkable.
IMPRESSION: Negative.

## 2020-05-17 IMAGING — US IR THORACENTESIS ASP PLEURAL SPACE W/IMG GUIDE
1 series · 3 of 3 positions shown · non-contrast
Comparison: none

INDICATION: Patient with history of ESRD, hypervolemia secondary from missed HD,
dyspnea, and right pleural effusion. Request is made for therapeutic
right thoracentesis.

[Series 1: ir (id) (id)/(id)/(id) ir · 3 of 3 slices shown]
[im 1/3]
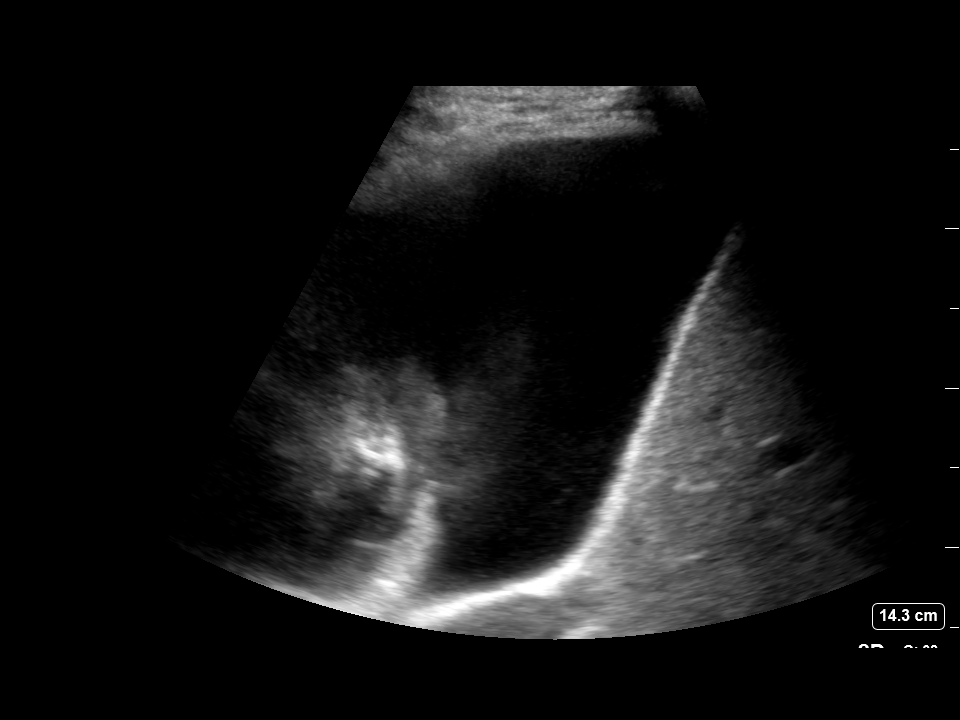
[im 2/3]
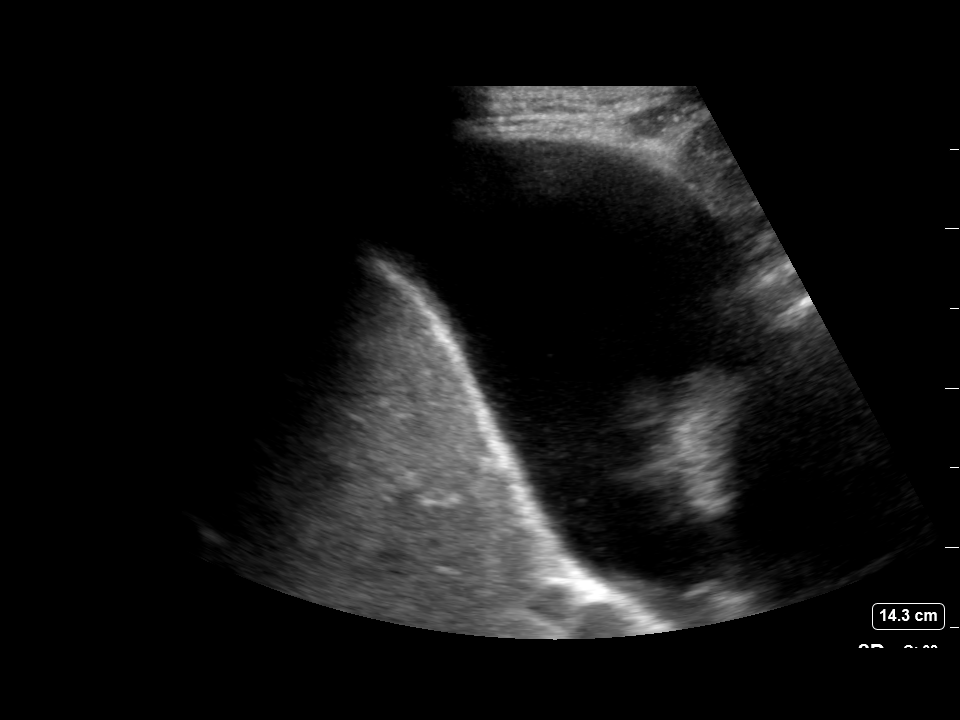
[im 3/3]
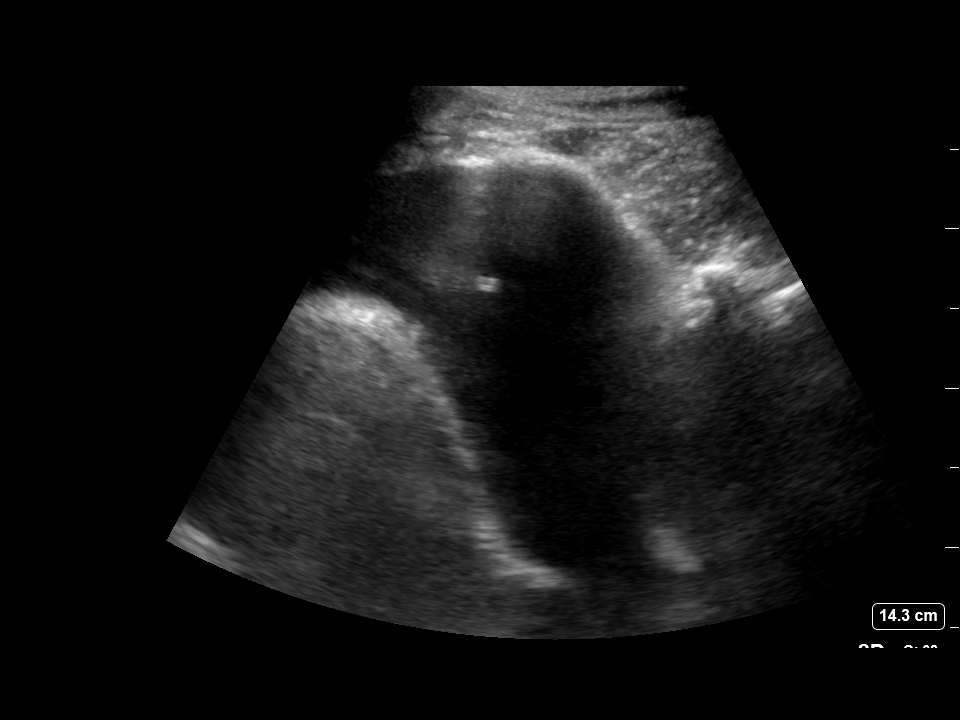

[3 of 3 positions shown; findings below may reference images not displayed]

EXAM:
ULTRASOUND GUIDED THERAPEUTIC RIGHT THORACENTESIS

MEDICATIONS:
10 mL 1% lidocaine

COMPLICATIONS:
None immediate.

PROCEDURE:
An ultrasound guided thoracentesis was thoroughly discussed with the
patient and questions answered. The benefits, risks, alternatives
and complications were also discussed. The patient understands and
wishes to proceed with the procedure. Written consent was obtained.

Ultrasound was performed to localize and mark an adequate pocket of
fluid in the right chest. The area was then prepped and draped in
the normal sterile fashion. 1% Lidocaine was used for local
anesthesia. Under ultrasound guidance a 6 Fr Safe-T-Centesis
catheter was introduced. Thoracentesis was performed. The catheter
was removed and a dressing applied.
FINDINGS: A total of approximately 850 mL of hazy gold fluid was removed.
Procedure was stopped after 850 mL due to patient coughing/vomiting.
IMPRESSION: Successful ultrasound guided right thoracentesis yielding 850 mL of
pleural fluid.

## 2020-05-17 IMAGING — DX CHEST  1 VIEW
1 series · 1 of 1 positions shown · non-contrast
Comparison: Radiographs September 28, 2018.

CLINICAL DATA: Status post right thoracentesis.

EXAM:
CHEST  1 VIEW

[chest ap]
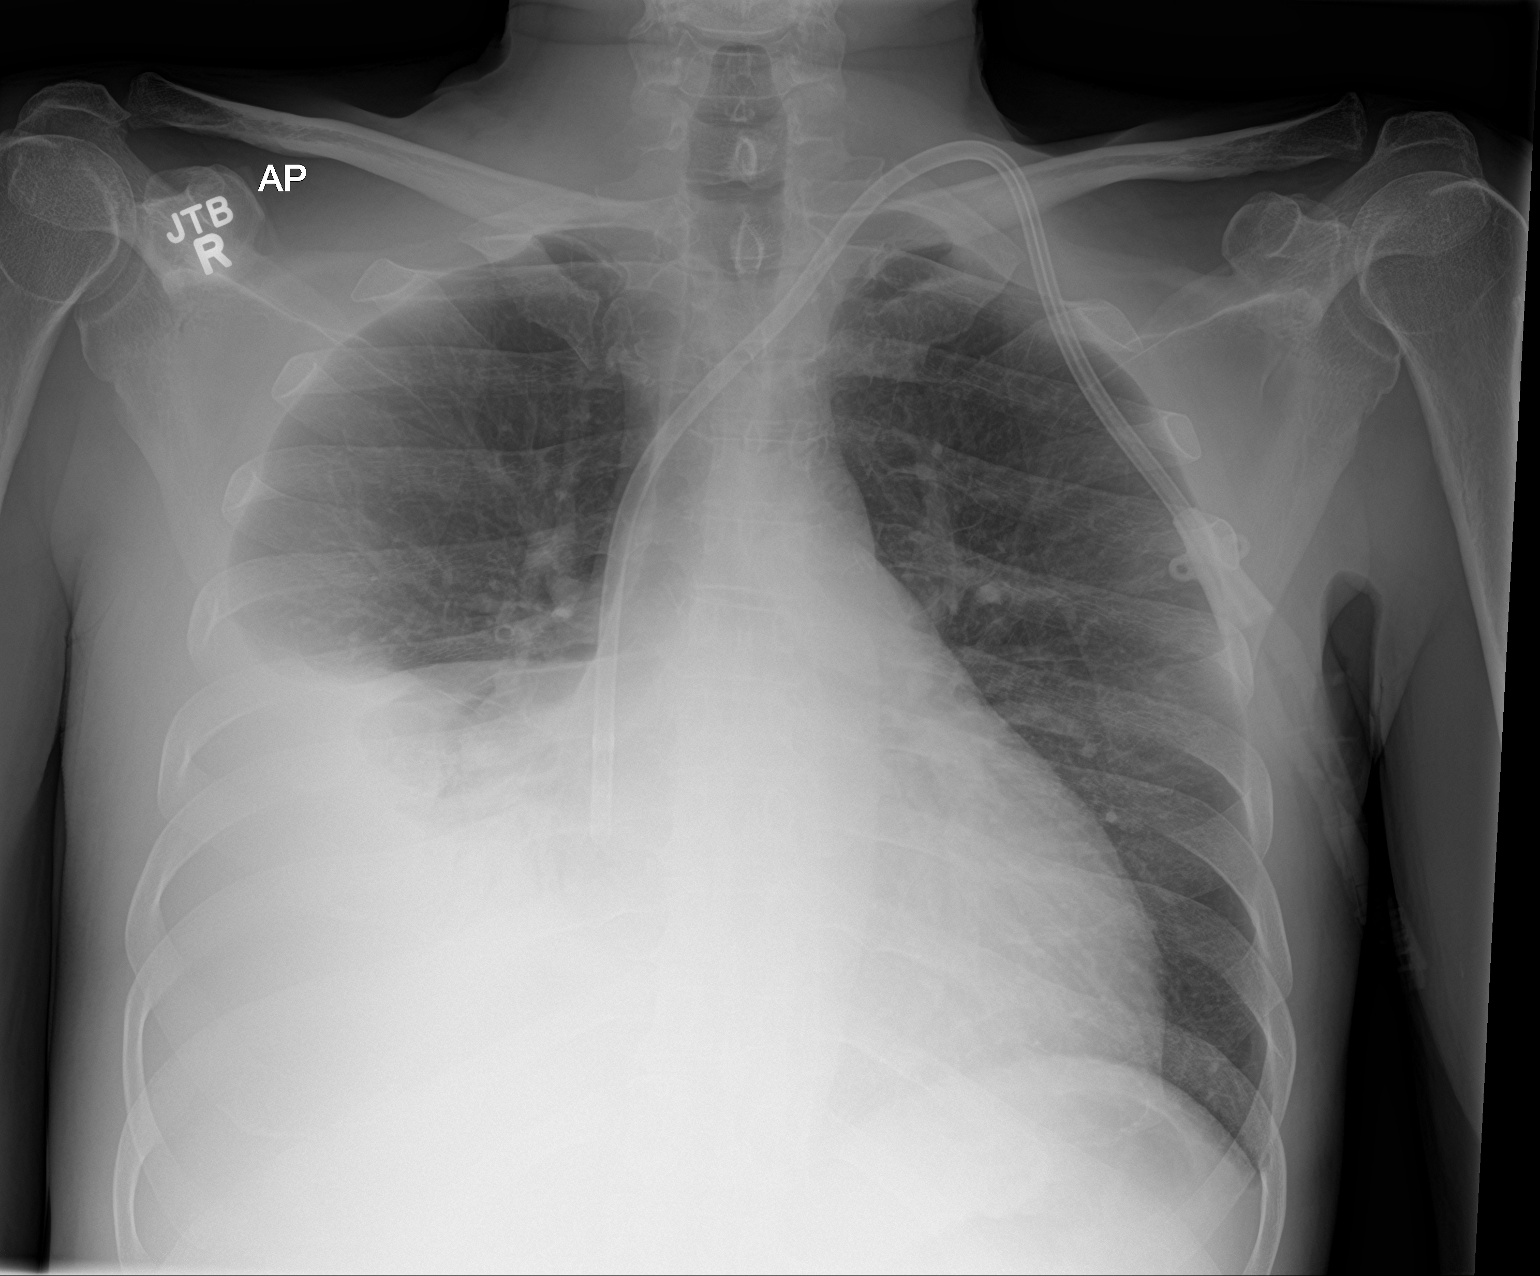

[1 of 1 positions shown; findings below may reference images not displayed]

FINDINGS: Stable cardiomediastinal silhouette. Left internal dialysis catheter
is unchanged. Left lung is clear. Moderate right pleural effusion is
noted which is slightly decreased compared to prior exam. No
pneumothorax is noted. Bony thorax is unremarkable.
IMPRESSION: Moderate right pleural effusion is noted which is slightly decreased
compared to prior exam. No pneumothorax is noted.
